# Patient Record
Sex: Male | Born: 1940 | Race: White | Hispanic: No | Marital: Married | Smoking: Never smoker
Health system: Southern US, Community
[De-identification: ages and names within clinical notes are randomized; demographics above are authoritative.]

## PROBLEM LIST (undated history)

## (undated) DIAGNOSIS — I639 Cerebral infarction, unspecified: Secondary | ICD-10-CM

## (undated) DIAGNOSIS — I4891 Unspecified atrial fibrillation: Secondary | ICD-10-CM

## (undated) DIAGNOSIS — I2510 Atherosclerotic heart disease of native coronary artery without angina pectoris: Secondary | ICD-10-CM

## (undated) DIAGNOSIS — H53452 Other localized visual field defect, left eye: Secondary | ICD-10-CM

## (undated) DIAGNOSIS — Z87442 Personal history of urinary calculi: Secondary | ICD-10-CM

## (undated) DIAGNOSIS — G20A1 Parkinson's disease without dyskinesia, without mention of fluctuations: Secondary | ICD-10-CM

## (undated) DIAGNOSIS — E7800 Pure hypercholesterolemia, unspecified: Secondary | ICD-10-CM

## (undated) DIAGNOSIS — I219 Acute myocardial infarction, unspecified: Secondary | ICD-10-CM

## (undated) DIAGNOSIS — G20 Parkinson's disease: Secondary | ICD-10-CM

## (undated) DIAGNOSIS — I509 Heart failure, unspecified: Secondary | ICD-10-CM

## (undated) HISTORY — PX: CARDIAC SURGERY: SHX584

## (undated) HISTORY — DX: Parkinson's disease without dyskinesia, without mention of fluctuations: G20.A1

## (undated) HISTORY — DX: Parkinson's disease: G20

---

## 1996-02-19 HISTORY — PX: CORONARY ARTERY BYPASS GRAFT: SHX141

## 1998-02-18 DIAGNOSIS — I639 Cerebral infarction, unspecified: Secondary | ICD-10-CM

## 1998-02-18 HISTORY — DX: Cerebral infarction, unspecified: I63.9

## 2004-05-26 ENCOUNTER — Emergency Department: Payer: Self-pay | Admitting: Emergency Medicine

## 2007-02-17 ENCOUNTER — Ambulatory Visit: Payer: Self-pay | Admitting: Neurology

## 2007-02-20 ENCOUNTER — Ambulatory Visit: Payer: Self-pay | Admitting: Cardiology

## 2007-06-19 ENCOUNTER — Ambulatory Visit: Payer: Self-pay | Admitting: Gastroenterology

## 2008-12-10 ENCOUNTER — Emergency Department: Payer: Self-pay | Admitting: Emergency Medicine

## 2009-09-19 ENCOUNTER — Ambulatory Visit: Payer: Self-pay | Admitting: Cardiology

## 2015-08-21 ENCOUNTER — Encounter: Payer: Self-pay | Admitting: Occupational Therapy

## 2015-08-21 ENCOUNTER — Ambulatory Visit: Payer: Medicare Other | Attending: Neurology | Admitting: Occupational Therapy

## 2015-08-21 ENCOUNTER — Ambulatory Visit: Payer: Medicare Other | Admitting: Speech Pathology

## 2015-08-21 ENCOUNTER — Encounter: Payer: Self-pay | Admitting: Speech Pathology

## 2015-08-21 VITALS — BP 142/78 | HR 60

## 2015-08-21 DIAGNOSIS — R490 Dysphonia: Secondary | ICD-10-CM | POA: Diagnosis present

## 2015-08-21 DIAGNOSIS — R2681 Unsteadiness on feet: Secondary | ICD-10-CM | POA: Diagnosis present

## 2015-08-21 DIAGNOSIS — R262 Difficulty in walking, not elsewhere classified: Secondary | ICD-10-CM | POA: Insufficient documentation

## 2015-08-21 DIAGNOSIS — M6281 Muscle weakness (generalized): Secondary | ICD-10-CM

## 2015-08-21 DIAGNOSIS — R278 Other lack of coordination: Secondary | ICD-10-CM | POA: Diagnosis present

## 2015-08-21 NOTE — Therapy (Signed)
[place] [facility] [address] [place], [place], [address] Phone: [phone]   Fax:  [phone]  Speech Language Pathology Evaluation/Discharge Summary  Patient Details  Name: Derrick Miller MRN: [id number] Date of Birth: 11-Sep-1940 Referring Provider: Dr. Sherryll BurgerShah  Encounter Date: 08/21/2015      End of Session - 08/21/15 1539    Visit Number 1   Number of Visits 1   Date for SLP Re-Evaluation 08/21/15   SLP Start Time 1400   SLP Stop Time  1445   SLP Time Calculation (min) 45 min   Activity Tolerance Patient tolerated treatment well      Past Medical History  Diagnosis Date   Parkinson disease (HCC)     No past surgical history on file.  There were no vitals filed for this visit.      Subjective Assessment - 08/21/15 1537    Subjective The patient reports minimal speech/voice changes due to Parkinson's; denies hypophonia, hoarse vocal quality, or monotone speech.   He and his wife endorse decreased facial expressions.  There are no reported swallowing problems.   Patient is accompained by: Family member   Currently in Pain? No/denies            SLP Evaluation [facility] - 08/21/15 1533    SLP Visit Information   SLP Received On 08/21/15   Referring Provider Dr. Sherryll BurgerShah   Onset Date 04/26/2015   Medical Diagnosis Parkinson's   Subjective   Subjective The patient reports minimal speech/voice changes due to Parkinson's   Patient/Family Stated Goal None   Pain Assessment   Currently in Pain? No/denies   Prior Functional Status   Cognitive/Linguistic Baseline Within functional limits   Oral Motor/Sensory Function   Overall Oral Motor/Sensory Function Appears within functional limits for tasks assessed   Motor Speech   Overall Motor Speech Appears within functional limits for tasks assessed   Standardized Assessments   Standardized Assessments  Other Assessment  LSVT-LOUD Perceptual voice Evaluation      LSVT-LOUD Voice Evaluation Voice history: The patient reports minimal speech/voice changes due to Parkinsons; denies hypophonia, hoarse vocal quality, or monotone speech.   He and his wife endorse decreased facial expressions.  There are no reported swallowing problems.  Maximum phonation time for sustained ah: 26 seconds  Mean intensity during sustained ah: 72 dB   Mean intensity sustained during conversational speech: 72 dB  Average fundamental frequency during sustained ah: 174 Hz   Visi-Pitch: Multi-Dimensional Voice Program (MDVP)  MDVP extracts objective quantitative values (Relative Average Perturbation, Shimmer, Voice Turbulence Index, and Noise to Harmonic Ratio) on sustained phonation, which are displayed graphically and numerically in comparison to a built-in normative database.  The patient exhibited values outside the norm for Shimmer (minimal).       SLP Education - 08/21/15 1538    Education provided Yes   Education Details LSVT-LOUD program; communication/swallowing symptoms to be on the look out for   Person(s) Educated Patient;Spouse   Methods Explanation   Comprehension Verbalized understanding              Plan - 08/21/15 1540    Clinical Impression Statement This 75 year old man with diagnosed Parkinson's disease is presenting with minimal communication difficulties due to Parkinson's disease.  The patient and his wife deny hypophonia, hoarse vocal quality, or monotone speech.   He and his wife endorse decreased facial expressions.  There are no reported swallowing problems.  Evaluation corroborates this-  the patient is presenting with vocal loudness and quality within normal limits.  The patient has opted to not begin LSVT-LOUD at this time.  The patient and his wife were educated regarding symptoms to be aware of that would warrant this treatment.  He will be coming for LSVT-BIG and we will monitor him for candidacy for the LOUD portion.   Speech Therapy  Frequency One time visit   Treatment/Interventions Other (comment)  LSVT-LOUD evaluation   SLP Home Exercise Plan Patient and wife given guidelines to determine need for intervention   Consulted and Agree with Plan of Care Patient;Family member/caregiver   Family Member Consulted Spouse      Patient will benefit from skilled therapeutic intervention in order to improve the following deficits and impairments:   Dysphonia - Plan: SLP plan of care cert/re-cert      G-Codes - 08/21/15 1543    Functional Assessment Tool Used LSVT-LOUD Perceptual Voice Evaluation   Functional Limitations Voice   Voice Current Status (G9171) At least 1 percent but less than 20 percent impaired, limited or restricted   Voice Goal Status (G9172) At least 1 percent but less than 20 percent impaired, limited or restricted   Voice Discharge Status (Z6109(G9173) At least 1 percent but less than 20 percent impaired, limited or restricted      Problem List There are no active problems to display for this patient.  Dollene PrimroseSusan G Jerric Oyen, MS/CCC- SLP  Leandrew KoyanagiAbernathy, Susie 08/21/2015, 3:49 PM  [place] [facility] [address] [place], [place], [address] Phone: [phone]   Fax:  [phone]  Name: Donshay C Miller MRN: [id number] Date of Birth: 1941-02-02

## 2015-08-24 NOTE — Therapy (Signed)
[place] [facility] [address] [place], [place], [address] Phone: [phone]   Fax:  [phone]  Occupational Therapy Evaluation  Patient Details  Name: Derrick Miller MRN: [id number] Date of Birth: 17-Oct-1940 No Data Recorded  Encounter Date: 08/21/2015      OT End of Session - 08/23/15 1533    Visit Number 1   Number of Visits 17   Date for OT Re-Evaluation 10/09/15   Authorization Type Medicare G code 1   OT Start Time 1300   OT Stop Time 1400   OT Time Calculation (min) 60 min   Activity Tolerance Patient tolerated treatment well   Behavior [facility] Therapy [facility] for tasks assessed/performed      Past Medical History  Diagnosis Date  . Parkinson disease (HCC)     History reviewed. No pertinent past surgical history.  Filed Vitals:   08/24/15 1533  BP: 142/78  Pulse: 60  SpO2: 98%        Subjective Assessment - 08/23/15 1528    Subjective  Patient reports he has had Parkinson's disease for about 2 years now, Reports having difficulty with going from sit to stand, navigating narrow places, rolling over in bed, right resting hand tremor and difficulty with handwriting.    Patient Stated Goals Patient wants to be as independent as possible. "Walk better"   Currently in Pain? No/denies   Multiple Pain Sites No           [facility] OT Assessment - 08/23/15 1529    Assessment   Diagnosis Parkinson's   Prior Therapy none   Balance Screen   Has the patient fallen in the past 6 months Yes   How many times? 1   Has the patient had a decrease in activity level because of a fear of falling?  No   Is the patient reluctant to leave their home because of a fear of falling?  No   Home  Environment   Family/patient expects to be discharged to: Private residence   Living Arrangements Spouse/significant other   Available Help at Discharge Family   Type of Home House   Home Access Stairs   Home Layout One level   Bathroom Copywriter, advertisinghower/Tub Walk-in Shower;Door   EcologistBathroom Toilet Standard   Bathroom Accessibility No   Lives With Spouse  judy   Prior Function   Level of Independence Independent   Vocation Retired   ADL   Eating/Feeding Modified independent   Grooming Modified independent   Upper Body Bathing Modified independent   Lower Body Bathing Modified independent   Upper Body Dressing Increased time;Needs assist for fasteners   Lower Body Dressing Increased time   Toilet Tranfer Modified independent   Toileting -  Hygiene Modified Independent;Increase time   Tub/Shower Transfer Modified independent   IADL   Prior Level of Function Shopping independent   Shopping Shops independently for small purchases   Light Housekeeping Maintains house alone or with occasional assistance   Meal Prep Prepares adequate meal if supplied with ingredients   Training and development officerCommunity Mobility Drives own vehicle   Medication Management Has difficulty remembering to take medication   Financial Management Manages day-to-day purchases, but needs help with banking, major purchases, etc.   Vision - History   Baseline Vision Wears contact  left visual field deficit, lower quadrant   Cognition   Memory Impaired   Memory Impairment Decreased short term memory   Sensation   Light Touch Appears Intact  Stereognosis Appears Intact   Hot/Cold Appears Intact   Proprioception Appears Intact   Coordination   Gross Motor Movements are Fluid and Coordinated No   Fine Motor Movements are Fluid and Coordinated No   Finger Nose Finger Test mildly impaired   9 Hole Peg Test Right;Left   Right 9 Hole Peg Test 30 secs   Left 9 Hole Peg Test 31 secs   Tone   Assessment Location --   ROM / Strength   AROM / PROM / Strength AROM;Strength   AROM   Overall AROM  Within functional limits for tasks performed   Strength   Overall Strength Deficits   Overall Strength Comments BUE strength 4/5 overall   Hand Function   Right Hand Grip (lbs) 80#    Left Hand Grip (lbs) 65#                         OT Education - 08/23/15 1532    Education provided Yes   Education Details LSVT BIG, goals   Person(s) Educated Patient;Spouse   Methods Explanation;Demonstration;Verbal cues   Comprehension Verbal cues required;Returned demonstration;Verbalized understanding             OT Long Term Goals - 08/24/15 1534    OT LONG TERM GOAL #1   Title Patient will improve gait speed and endurance and be able to walk 1500 feet in 6 minutes to negotiate around the home and community safely in 4 weeks   Baseline 1395 feet at eval   Time 4   Period Weeks   Status New   OT LONG TERM GOAL #2   Title Patient will complete HEP for maximal daily exercises with modified independence in 4 weeks    Baseline no current program   Time 4   Period Weeks   Status New   OT LONG TERM GOAL #3   Title Patient will transfer from sit to stand without the use of arms safely and independently from a variety of chairs/surfaces in 4 weeks.   Baseline 18 secs 5 times sit to stand   Time 4   Period Weeks   Status New   OT LONG TERM GOAL #4   Title Patient will demonstrate rolling in bed with modified independence.   Baseline difficulty at eval   Time 4   Period Weeks   Status New   OT LONG TERM GOAL #5   Title Patient will complete printing and signing name on important documents with 100 legibility and no micrographia.   Baseline mild micrographia noted   Time 4   Period Weeks   Status New   Long Term Additional Goals   Additional Long Term Goals Yes   OT LONG TERM GOAL #6   Title Patient will complete navigating in narrow spaces without evidence of freezing or hesitations.   Time 4   Period Weeks   Status New               Plan - 08/23/15 1534    Clinical Impression Statement Patient is a 75 yo male diagnosed with Parkinson's disease and was referred by his physician for LSVT BIG program. Patient presents with forwards  flexed posture, tremors in the right hand, decreased step length with gait patterns, decreased ability to transition from sit to stand, decreased reciprocal arm swing, decreased balance, freezing/hesitation of gait with initiation of gait as well as with turns and especially in narrow places, decreased coordination, mild  micrographia with handwriting and decreased muscle strength which affect his ability to perform daily tasks. The patient is judged to be an excellent candidate for the LSVT BIG program. He would benefit from and was referred for the LSVT BIG program which is an intensive program designed specifically for Parkinson's patients with a focus on increasing amplitude and speed of movements, improving self-care and daily tasks and providing patients with daily exercises to improve overall function. It is recommended that the patient receive the LSVT BIG program which is comprised of 16 intensive sessions (4 times per week for 4 weeks, one hour sessions). Prognosis for improvement is good based on his motivation and family support. LSVT BIG has been documented in the literature as efficacious for individuals with Parkinson's disease.        Rehab Potential Good   OT Frequency 4x / week   OT Duration 4 weeks   OT Treatment/Interventions Self-care/ADL training;Therapeutic exercise;Functional Mobility Training;Patient/family education;Neuromuscular education;Balance training;Therapeutic exercises;DME and/or AE instruction;Therapeutic activities;Gait Training;Stair Training   Consulted and Agree with Plan of Care Patient;Family member/caregiver   Family Member Consulted wife, Darel HongJudy      Patient will benefit from skilled therapeutic intervention in order to improve the following deficits and impairments:  Abnormal gait, Improper body mechanics, Decreased activity tolerance, Decreased knowledge of use of DME, Decreased strength, Decreased balance, Decreased mobility, Difficulty walking, Decreased  coordination, Pain, Impaired UE functional use  Visit Diagnosis: Difficulty in walking, not elsewhere classified - Plan: Ot plan of care cert/re-cert  Muscle weakness (generalized) - Plan: Ot plan of care cert/re-cert  Other lack of coordination - Plan: Ot plan of care cert/re-cert  Unsteadiness on feet - Plan: Ot plan of care cert/re-cert    Problem List There are no active problems to display for this patient.  Kerrie Buffalomy T Akeyla Molden, OTR/L, CLT  Shaquitta Burbridge 08/24/2015, 3:44 PM  [place] [facility] [address] [place], [place], [address] Phone: [phone]   Fax:  [phone]  Name: Derrick Miller MRN: [id number] Date of Birth: [birth year]

## 2015-08-28 ENCOUNTER — Encounter: Payer: Medicare Other | Admitting: Speech Pathology

## 2015-08-28 ENCOUNTER — Encounter: Payer: Medicare Other | Admitting: Occupational Therapy

## 2015-08-29 ENCOUNTER — Encounter: Payer: Medicare Other | Admitting: Occupational Therapy

## 2015-08-29 ENCOUNTER — Encounter: Payer: Medicare Other | Admitting: Speech Pathology

## 2015-08-30 ENCOUNTER — Encounter: Payer: Medicare Other | Admitting: Occupational Therapy

## 2015-08-30 ENCOUNTER — Encounter: Payer: Medicare Other | Admitting: Speech Pathology

## 2015-08-31 ENCOUNTER — Encounter: Payer: Medicare Other | Admitting: Speech Pathology

## 2015-08-31 ENCOUNTER — Encounter: Payer: Medicare Other | Admitting: Occupational Therapy

## 2015-09-04 ENCOUNTER — Encounter: Payer: Medicare Other | Admitting: Occupational Therapy

## 2015-09-04 ENCOUNTER — Encounter: Payer: Medicare Other | Admitting: Speech Pathology

## 2015-09-05 ENCOUNTER — Encounter: Payer: Medicare Other | Admitting: Speech Pathology

## 2015-09-05 ENCOUNTER — Encounter: Payer: Medicare Other | Admitting: Occupational Therapy

## 2015-09-06 ENCOUNTER — Encounter: Payer: Medicare Other | Admitting: Speech Pathology

## 2015-09-06 ENCOUNTER — Encounter: Payer: Medicare Other | Admitting: Occupational Therapy

## 2015-09-07 ENCOUNTER — Encounter: Payer: Medicare Other | Admitting: Occupational Therapy

## 2015-09-07 ENCOUNTER — Encounter: Payer: Medicare Other | Admitting: Speech Pathology

## 2015-09-11 ENCOUNTER — Encounter: Payer: Medicare Other | Admitting: Speech Pathology

## 2015-09-11 ENCOUNTER — Encounter: Payer: Medicare Other | Admitting: Occupational Therapy

## 2015-09-12 ENCOUNTER — Encounter: Payer: Medicare Other | Admitting: Speech Pathology

## 2015-09-12 ENCOUNTER — Encounter: Payer: Medicare Other | Admitting: Occupational Therapy

## 2015-09-13 ENCOUNTER — Encounter: Payer: Medicare Other | Admitting: Occupational Therapy

## 2015-09-13 ENCOUNTER — Encounter: Payer: Medicare Other | Admitting: Speech Pathology

## 2015-09-14 ENCOUNTER — Encounter: Payer: Medicare Other | Admitting: Speech Pathology

## 2015-09-14 ENCOUNTER — Encounter: Payer: Medicare Other | Admitting: Occupational Therapy

## 2015-09-18 ENCOUNTER — Encounter: Payer: Medicare Other | Admitting: Occupational Therapy

## 2015-09-18 ENCOUNTER — Encounter: Payer: Medicare Other | Admitting: Speech Pathology

## 2015-09-19 ENCOUNTER — Encounter: Payer: Medicare Other | Admitting: Speech Pathology

## 2015-09-19 ENCOUNTER — Encounter: Payer: Medicare Other | Admitting: Occupational Therapy

## 2015-09-20 ENCOUNTER — Encounter: Payer: Medicare Other | Admitting: Speech Pathology

## 2015-09-20 ENCOUNTER — Encounter: Payer: Medicare Other | Admitting: Occupational Therapy

## 2015-09-21 ENCOUNTER — Encounter: Payer: Medicare Other | Admitting: Speech Pathology

## 2015-09-21 ENCOUNTER — Encounter: Payer: Medicare Other | Admitting: Occupational Therapy

## 2015-10-02 ENCOUNTER — Ambulatory Visit: Payer: Medicare Other | Admitting: Occupational Therapy

## 2015-10-03 ENCOUNTER — Ambulatory Visit: Payer: Medicare Other | Attending: Neurology | Admitting: Occupational Therapy

## 2015-10-03 DIAGNOSIS — R262 Difficulty in walking, not elsewhere classified: Secondary | ICD-10-CM | POA: Diagnosis present

## 2015-10-03 DIAGNOSIS — R2681 Unsteadiness on feet: Secondary | ICD-10-CM | POA: Insufficient documentation

## 2015-10-03 DIAGNOSIS — M6281 Muscle weakness (generalized): Secondary | ICD-10-CM | POA: Diagnosis present

## 2015-10-03 DIAGNOSIS — R278 Other lack of coordination: Secondary | ICD-10-CM | POA: Insufficient documentation

## 2015-10-04 ENCOUNTER — Ambulatory Visit: Payer: Medicare Other | Admitting: Occupational Therapy

## 2015-10-04 DIAGNOSIS — R262 Difficulty in walking, not elsewhere classified: Secondary | ICD-10-CM | POA: Diagnosis not present

## 2015-10-04 DIAGNOSIS — M6281 Muscle weakness (generalized): Secondary | ICD-10-CM

## 2015-10-04 DIAGNOSIS — R2681 Unsteadiness on feet: Secondary | ICD-10-CM

## 2015-10-04 DIAGNOSIS — R278 Other lack of coordination: Secondary | ICD-10-CM

## 2015-10-05 ENCOUNTER — Ambulatory Visit: Payer: Medicare Other | Admitting: Occupational Therapy

## 2015-10-05 DIAGNOSIS — R2681 Unsteadiness on feet: Secondary | ICD-10-CM

## 2015-10-05 DIAGNOSIS — M6281 Muscle weakness (generalized): Secondary | ICD-10-CM

## 2015-10-05 DIAGNOSIS — R278 Other lack of coordination: Secondary | ICD-10-CM

## 2015-10-05 DIAGNOSIS — R262 Difficulty in walking, not elsewhere classified: Secondary | ICD-10-CM | POA: Diagnosis not present

## 2015-10-07 ENCOUNTER — Encounter: Payer: Self-pay | Admitting: Occupational Therapy

## 2015-10-07 NOTE — Therapy (Signed)
[place] [facility] [address] [place], [place], [address] Phone: [phone]   Fax:  [phone]  Occupational Therapy Treatment  Patient Details  Name: Derrick Miller MRN: [id number] Date of Birth: Nov 23, 1940 No Data Recorded  Encounter Date: 10/05/2015      OT End of Session - 10/07/15 2005    Visit Number 4   Number of Visits 17   Date for OT Re-Evaluation 11/10/15   Authorization Type Medicare G code 4   OT Start Time 1102   OT Stop Time 1205   OT Time Calculation (min) 63 min   Activity Tolerance Patient tolerated treatment well   Behavior During Therapy [facility] for tasks assessed/performed      Past Medical History:  Diagnosis Date  . Parkinson disease (HCC)     History reviewed. No pertinent surgical history.  There were no vitals filed for this visit.      Subjective Assessment - 10/07/15 2001    Subjective  patient reports he was able to perform floor to ceiling exercise at home and probably performed 150 repetitions. He reports low back this date. Advised patient to perform 10 repetitions of each exercise two times a day and monitor back pain. Wife present and taking notes written exercise program for Home.    Patient is accompained by: Family member   Patient Stated Goals Patient wants to be as independent as possible. "Walk better"   Currently in Pain? Yes   Pain Score 4    Pain Location Back   Pain Orientation Lower   Pain Descriptors / Indicators Dull   Pain Type Acute pain   Pain Onset Yesterday   Multiple Pain Sites No                      OT Treatments/Exercises (OP) - 10/07/15 2002      ADLs   ADL Comments Functional component tasks as follows; sit to stand, rolling in bed, turning without hesitation, initiation of stepping, managing curbs., performed with verbal cues and minimal assist      Exercises   Exercises Neurological Re-education     Neurological Re-education  Exercises   Other Exercises 1 Patient seen for instruction of LSVT BIG exercises: LSVT Daily Session Maximal Daily Exercises: Sustained movements are designed to rescale the amplitude of movement output for generalization to daily functional activities. Performed as follows for 1 set of 10 repetitions each: Multi directional sustained movements- 1) Floor to ceiling, 2) Side to side. Multi directional Repetitive movements performed in standing and are designed to provide retraining effort needed for sustained muscle activation in tasks Performed as follows: 3) Step and reach forward, 4) Step and Reach Backwards, 5) Step and reach sideways, 6) Rock and reach forward/backward, 7) Rock and reach sideways. Seated exercises required cues and therapist guiding. For standing exercises, patient required minimal to moderate assist to perform and for balance, . Sit to stand from mat table on lowest setting with cues for weight shift, technique and CGA to minimal assist for 10 reps for 1 set.    Other Exercises 2 instructed on adapted version of exercises this date with wife present.                OT Education - 10/07/15 2004    Education provided Yes   Education Details adapted maximal daily exercises with use of chair   Person(s) Educated Patient;Spouse   Methods Explanation;Demonstration;Tactile cues;Verbal cues  Comprehension Verbalized understanding;Returned demonstration;Verbal cues required;Tactile cues required             OT Long Term Goals - 08/24/15 1534      OT LONG TERM GOAL #1   Title Patient will improve gait speed and endurance and be able to walk 1500 feet in 6 minutes to negotiate around the home and community safely in 4 weeks   Baseline 1395 feet at eval   Time 4   Period Weeks   Status New     OT LONG TERM GOAL #2   Title Patient will complete HEP for maximal daily exercises with modified independence in 4 weeks    Baseline no current program   Time 4   Period  Weeks   Status New     OT LONG TERM GOAL #3   Title Patient will transfer from sit to stand without the use of arms safely and independently from a variety of chairs/surfaces in 4 weeks.   Baseline 18 secs 5 times sit to stand   Time 4   Period Weeks   Status New     OT LONG TERM GOAL #4   Title Patient will demonstrate rolling in bed with modified independence.   Baseline difficulty at eval   Time 4   Period Weeks   Status New     OT LONG TERM GOAL #5   Title Patient will complete printing and signing name on important documents with 100 legibility and no micrographia.   Baseline mild micrographia noted   Time 4   Period Weeks   Status New     Long Term Additional Goals   Additional Long Term Goals Yes     OT LONG TERM GOAL #6   Title Patient will complete navigating in narrow spaces without evidence of freezing or hesitations.   Time 4   Period Weeks   Status New               Plan - 10/07/15 2006    Clinical Impression Statement Patient becoming more familiar with exercises this date but relies on therapist instruction and pictures from handouts. He will need to monitor Low back pain while performing exercises. It is likely he completed too many repetitions at home. Patient demonstrates mild freezing behaviors during initiation of gate and turning behaviors. Will you to benefit from therapist instruction, shaping, calibration, and repetition of exercises to become more proficient. Will plan to focus initiation and termination gait patterns. Patient to perform maximal exercise two times for a day for the next 3 days.   Rehab Potential Good   Clinical Impairments Affecting Rehab Potential progressive disease process, balance   OT Frequency 4x / week   OT Duration 4 weeks   OT Treatment/Interventions Self-care/ADL training;Therapeutic exercise;Functional Mobility Training;Patient/family education;Neuromuscular education;Balance training;Therapeutic exercises;DME  and/or AE instruction;Therapeutic activities;Gait Training;Stair Training   Consulted and Agree with Plan of Care Patient;Family member/caregiver   Family Member Consulted wife, Darel HongJudy      Patient will benefit from skilled therapeutic intervention in order to improve the following deficits and impairments:  Abnormal gait, Improper body mechanics, Decreased activity tolerance, Decreased knowledge of use of DME, Decreased strength, Decreased balance, Decreased mobility, Difficulty walking, Decreased coordination, Pain, Impaired UE functional use  Visit Diagnosis: Difficulty in walking, not elsewhere classified  Unsteadiness on feet  Other lack of coordination  Muscle weakness (generalized)    Problem List There are no active problems to display for this patient.  Brnadon Eoff T  Arne Cleveland, OTR/L, CLT  Olive Zmuda 10/07/2015, 8:08 PM  [place] [facility] MAIN [facility] SERVICES [address] [place], [place], [address] Phone: [phone]   Fax:  [phone]  Name: Derrick Miller MRN: [id number] Date of Birth: 11-Jun-1940

## 2015-10-07 NOTE — Therapy (Signed)
[facility] [address] [place], [place], [address] Phone: [phone]   Fax:  [phone]  Occupational Therapy Treatment  Patient Details  Name: Derrick Miller MRN: [id number] Date of Birth: 21-Oct-1940 No Data Recorded  Encounter Date: 10/04/2015      OT End of Session - 10/07/15 1956    Visit Number 3   Number of Visits 17   Date for OT Re-Evaluation 11/10/15   Authorization Type Medicare G code 3   OT Start Time 1101   OT Stop Time 1200   OT Time Calculation (min) 59 min   Activity Tolerance Patient tolerated treatment well   Behavior During Therapy [facility] for tasks assessed/performed      Past Medical History:  Diagnosis Date  . Parkinson disease (HCC)     History reviewed. No pertinent surgical history.  There were no vitals filed for this visit.      Subjective Assessment - 10/07/15 1952    Subjective  Patient reports he has mild muscle soreness. Patient is concerned about being able to recall exercises. His wife came in for therapy session today do you take notes and observe.    Patient is accompained by: Family member   Pertinent History Patient was evaluated last month however, start date was delayed due to therapist on medical leave and patient preferred to wait to work with this particular therapist.   Patient Stated Goals Patient wants to be as independent as possible. "Walk better"   Currently in Pain? No/denies   Multiple Pain Sites No                      OT Treatments/Exercises (OP) - 10/07/15 1953      ADLs   ADL Comments Functional component tasks as follows; sit to stand, rolling in bed, turning without hesitation, initiation of stepping, managing curbs.     Exercises   Exercises Neurological Re-education     Neurological Re-education Exercises   Other Exercises 1 Patient seen for instruction of LSVT BIG exercises: LSVT Daily Session Maximal Daily Exercises: Sustained  movements are designed to rescale the amplitude of movement output for generalization to daily functional activities. Performed as follows for 1 set of 10 repetitions each: Multi directional sustained movements- 1) Floor to ceiling, 2) Side to side. Multi directional Repetitive movements performed in standing and are designed to provide retraining effort needed for sustained muscle activation in tasks Performed as follows: 3) Step and reach forward, 4) Step and Reach Backwards, 5) Step and reach sideways, 6) Rock and reach forward/backward, 7) Rock and reach sideways. Seated exercises required cues and therapist guiding. For standing exercises, patient required minimal to moderate assist to perform and for balance. Sit to stand from mat table on lowest setting with cues for weight shift, technique and CGA to minimal assist for 10 reps for 1 set.       Implemented use of chair for adapted version of exercises this date for balance and safety.          OT Education - 10/07/15 1955    Education provided Yes   Education Details maximal daily exercises, HEP   Person(s) Educated Patient;Spouse   Methods Explanation;Demonstration;Tactile cues;Verbal cues   Comprehension Verbal cues required;Returned demonstration;Verbalized understanding;Tactile cues required             OT Long Term Goals - 08/24/15 1534      OT LONG TERM GOAL #1  Title Patient will improve gait speed and endurance and be able to walk 1500 feet in 6 minutes to negotiate around the home and community safely in 4 weeks   Baseline 1395 feet at eval   Time 4   Period Weeks   Status New     OT LONG TERM GOAL #2   Title Patient will complete HEP for maximal daily exercises with modified independence in 4 weeks    Baseline no current program   Time 4   Period Weeks   Status New     OT LONG TERM GOAL #3   Title Patient will transfer from sit to stand without the use of arms safely and independently from a variety of  chairs/surfaces in 4 weeks.   Baseline 18 secs 5 times sit to stand   Time 4   Period Weeks   Status New     OT LONG TERM GOAL #4   Title Patient will demonstrate rolling in bed with modified independence.   Baseline difficulty at eval   Time 4   Period Weeks   Status New     OT LONG TERM GOAL #5   Title Patient will complete printing and signing name on important documents with 100 legibility and no micrographia.   Baseline mild micrographia noted   Time 4   Period Weeks   Status New     Long Term Additional Goals   Additional Long Term Goals Yes     OT LONG TERM GOAL #6   Title Patient will complete navigating in narrow spaces without evidence of freezing or hesitations.   Time 4   Period Weeks   Status New               Plan - 10/07/15 1956    Clinical Impression Statement Patient was unable to recall exercises from previous session. He did state that they appeared familiar. Continue to require increased assistance for balance and standing implemented adapted version using a chair. Will plan to issue written and pictorial instructions next session. Wife is planning to attend again next date.    Rehab Potential Good   Clinical Impairments Affecting Rehab Potential progressive disease process, balance   OT Frequency 4x / week   OT Duration 4 weeks   OT Treatment/Interventions Self-care/ADL training;Therapeutic exercise;Functional Mobility Training;Patient/family education;Neuromuscular education;Balance training;Therapeutic exercises;DME and/or AE instruction;Therapeutic activities;Gait Training;Stair Training   Consulted and Agree with Plan of Care Patient;Family member/caregiver   Family Member Consulted wife, Darel HongJudy      Patient will benefit from skilled therapeutic intervention in order to improve the following deficits and impairments:  Abnormal gait, Improper body mechanics, Decreased activity tolerance, Decreased knowledge of use of DME, Decreased strength,  Decreased balance, Decreased mobility, Difficulty walking, Decreased coordination, Pain, Impaired UE functional use  Visit Diagnosis: Difficulty in walking, not elsewhere classified  Muscle weakness (generalized)  Other lack of coordination  Unsteadiness on feet    Problem List There are no active problems to display for this patient.  Kerrie Buffalomy T Lovett, OTR/L, CLT  Lovett,Amy 10/07/2015, 7:58 PM  [facility] [address] [place], [place], [address] Phone: [phone]   Fax:  [phone]  Name: Derrick Miller MRN: [id number] Date of Birth: 08-23-1940

## 2015-10-07 NOTE — Therapy (Signed)
[facility] [address] [place], [place], [address] Phone: [phone]   Fax:  [phone]  Occupational Therapy Treatment  Patient Details  Name: Derrick Miller MRN: [id number] Date of Birth: [birth year] No Data Recorded  Encounter Date: 10/03/2015      OT End of Session - 10/07/15 1937    Visit Number 2   Number of Visits 17   Date for OT Re-Evaluation 11/10/15   Authorization Type Medicare G code 2   OT Start Time 1100   OT Stop Time 1201   OT Time Calculation (min) 61 min   Activity Tolerance Patient tolerated treatment well   Behavior During Therapy [facility] for tasks assessed/performed      Past Medical History:  Diagnosis Date  . Parkinson disease (HCC)     History reviewed. No pertinent surgical history.  There were no vitals filed for this visit.      Subjective Assessment - 10/07/15 1930    Subjective  Patient reports he is ready to get started. He denies any falls in the last four weeks. No complaints this date. His wife brought him to therapy but did not attend session.    Patient is accompained by: Family member   Pertinent History Patient was evaluated last month however, start date was delayed due to therapist on medical leave and patient preferred to wait to work with this particular therapist.   Patient Stated Goals Patient wants to be as independent as possible. "Walk better"   Currently in Pain? No/denies   Multiple Pain Sites No                      OT Treatments/Exercises (OP) - 10/07/15 1933      ADLs   ADL Comments Established functional component skills as follows; sit to stand, rolling in bed, turning without hesitation, initiation of stepping, managing curbs.      Exercises   Exercises Neurological Re-education     Neurological Re-education Exercises   Other Exercises 1 Patient seen for initial instruction of LSVT BIG exercises: LSVT Daily Session Maximal Daily  Exercises: Sustained movements are designed to rescale the amplitude of movement output for generalization to daily functional activities. Performed as follows for 1 set of 10 repetitions each: Multi directional sustained movements- 1) Floor to ceiling, 2) Side to side. Multi directional Repetitive movements performed in standing and are designed to provide retraining effort needed for sustained muscle activation in tasks Performed as follows: 3) Step and reach forward, 4) Step and Reach Backwards, 5) Step and reach sideways, 6) Rock and reach forward/backward, 7) Rock and reach sideways. Seated exercises required cues and therapist guiding.  For standing exercises, patient required minimal to moderate assist to perform and for balance.  Sit to stand from mat table on lowest setting with cues for weight shift, technique and CGA to minimal assist for 10 reps for 1 set.                   OT Education - 10/07/15 1936    Education provided Yes   Education Details LSVT BIG, maximal daily exercises, functional component tasks.   Person(s) Educated Patient   Methods Explanation;Demonstration;Tactile cues;Verbal cues   Comprehension Verbalized understanding;Returned demonstration;Verbal cues required;Tactile cues required             OT Long Term Goals - 08/24/15 1534      OT LONG TERM GOAL #1   Title  Patient will improve gait speed and endurance and be able to walk 1500 feet in 6 minutes to negotiate around the home and community safely in 4 weeks   Baseline 1395 feet at eval   Time 4   Period Weeks   Status New     OT LONG TERM GOAL #2   Title Patient will complete HEP for maximal daily exercises with modified independence in 4 weeks    Baseline no current program   Time 4   Period Weeks   Status New     OT LONG TERM GOAL #3   Title Patient will transfer from sit to stand without the use of arms safely and independently from a variety of chairs/surfaces in 4 weeks.    Baseline 18 secs 5 times sit to stand   Time 4   Period Weeks   Status New     OT LONG TERM GOAL #4   Title Patient will demonstrate rolling in bed with modified independence.   Baseline difficulty at eval   Time 4   Period Weeks   Status New     OT LONG TERM GOAL #5   Title Patient will complete printing and signing name on important documents with 100 legibility and no micrographia.   Baseline mild micrographia noted   Time 4   Period Weeks   Status New     Long Term Additional Goals   Additional Long Term Goals Yes     OT LONG TERM GOAL #6   Title Patient will complete navigating in narrow spaces without evidence of freezing or hesitations.   Time 4   Period Weeks   Status New               Plan - 10/07/15 1938    Clinical Impression Statement Patient was seen this date for initiation of maximal daily exercises as per protocol for LSVT big. Patient required minimal to moderate assistance for completion of exercises with increased assistance for balance during standing exercises. He required moderate verbal cueing for technique. Occasional rest breaks required. He will likely require adapted version of exercises for home program.    Rehab Potential Good   Clinical Impairments Affecting Rehab Potential progressive disease process, balance   OT Frequency 4x / week   OT Duration 4 weeks   OT Treatment/Interventions Self-care/ADL training;Therapeutic exercise;Functional Mobility Training;Patient/family education;Neuromuscular education;Balance training;Therapeutic exercises;DME and/or AE instruction;Therapeutic activities;Gait Veterinary surgeonTraining;Stair Training   Consulted and Agree with Plan of Care Patient      Patient will benefit from skilled therapeutic intervention in order to improve the following deficits and impairments:  Abnormal gait, Improper body mechanics, Decreased activity tolerance, Decreased knowledge of use of DME, Decreased strength, Decreased balance, Decreased  mobility, Difficulty walking, Decreased coordination, Pain, Impaired UE functional use  Visit Diagnosis: Difficulty in walking, not elsewhere classified - Plan: Ot plan of care cert/re-cert  Muscle weakness (generalized) - Plan: Ot plan of care cert/re-cert  Other lack of coordination - Plan: Ot plan of care cert/re-cert  Unsteadiness on feet - Plan: Ot plan of care cert/re-cert    Problem List There are no active problems to display for this patient.  Kerrie Buffalomy T Lovett, OTR/L, CLT  Lovett,Amy 10/07/2015, 7:46 PM  [facility] [address] [place], [place], [address] Phone: [phone]   Fax:  [phone]  Name: Derrick Miller MRN: [id number] Date of Birth: 1940/10/15

## 2015-10-09 ENCOUNTER — Ambulatory Visit: Payer: Medicare Other | Admitting: Occupational Therapy

## 2015-10-09 DIAGNOSIS — R262 Difficulty in walking, not elsewhere classified: Secondary | ICD-10-CM

## 2015-10-09 DIAGNOSIS — R278 Other lack of coordination: Secondary | ICD-10-CM

## 2015-10-09 DIAGNOSIS — M6281 Muscle weakness (generalized): Secondary | ICD-10-CM

## 2015-10-09 DIAGNOSIS — R2681 Unsteadiness on feet: Secondary | ICD-10-CM

## 2015-10-10 ENCOUNTER — Ambulatory Visit: Payer: Medicare Other | Admitting: Occupational Therapy

## 2015-10-10 DIAGNOSIS — R2681 Unsteadiness on feet: Secondary | ICD-10-CM

## 2015-10-10 DIAGNOSIS — R262 Difficulty in walking, not elsewhere classified: Secondary | ICD-10-CM | POA: Diagnosis not present

## 2015-10-10 DIAGNOSIS — R278 Other lack of coordination: Secondary | ICD-10-CM

## 2015-10-10 DIAGNOSIS — M6281 Muscle weakness (generalized): Secondary | ICD-10-CM

## 2015-10-11 ENCOUNTER — Ambulatory Visit: Payer: Medicare Other | Admitting: Occupational Therapy

## 2015-10-11 DIAGNOSIS — R262 Difficulty in walking, not elsewhere classified: Secondary | ICD-10-CM

## 2015-10-11 DIAGNOSIS — R278 Other lack of coordination: Secondary | ICD-10-CM

## 2015-10-11 DIAGNOSIS — R2681 Unsteadiness on feet: Secondary | ICD-10-CM

## 2015-10-11 DIAGNOSIS — M6281 Muscle weakness (generalized): Secondary | ICD-10-CM

## 2015-10-12 ENCOUNTER — Ambulatory Visit: Payer: Medicare Other | Admitting: Occupational Therapy

## 2015-10-12 DIAGNOSIS — M6281 Muscle weakness (generalized): Secondary | ICD-10-CM

## 2015-10-12 DIAGNOSIS — R2681 Unsteadiness on feet: Secondary | ICD-10-CM

## 2015-10-12 DIAGNOSIS — R278 Other lack of coordination: Secondary | ICD-10-CM

## 2015-10-12 DIAGNOSIS — R262 Difficulty in walking, not elsewhere classified: Secondary | ICD-10-CM

## 2015-10-16 ENCOUNTER — Encounter: Payer: Self-pay | Admitting: Occupational Therapy

## 2015-10-16 ENCOUNTER — Ambulatory Visit: Payer: Medicare Other | Admitting: Occupational Therapy

## 2015-10-16 DIAGNOSIS — R262 Difficulty in walking, not elsewhere classified: Secondary | ICD-10-CM

## 2015-10-16 DIAGNOSIS — R278 Other lack of coordination: Secondary | ICD-10-CM

## 2015-10-16 DIAGNOSIS — R2681 Unsteadiness on feet: Secondary | ICD-10-CM

## 2015-10-16 DIAGNOSIS — M6281 Muscle weakness (generalized): Secondary | ICD-10-CM

## 2015-10-17 ENCOUNTER — Ambulatory Visit: Payer: Medicare Other | Admitting: Occupational Therapy

## 2015-10-17 DIAGNOSIS — R262 Difficulty in walking, not elsewhere classified: Secondary | ICD-10-CM

## 2015-10-17 DIAGNOSIS — R278 Other lack of coordination: Secondary | ICD-10-CM

## 2015-10-17 DIAGNOSIS — M6281 Muscle weakness (generalized): Secondary | ICD-10-CM

## 2015-10-17 DIAGNOSIS — R2681 Unsteadiness on feet: Secondary | ICD-10-CM

## 2015-10-18 ENCOUNTER — Encounter: Payer: Self-pay | Admitting: Occupational Therapy

## 2015-10-18 ENCOUNTER — Ambulatory Visit: Payer: Medicare Other | Admitting: Occupational Therapy

## 2015-10-18 DIAGNOSIS — R262 Difficulty in walking, not elsewhere classified: Secondary | ICD-10-CM

## 2015-10-18 DIAGNOSIS — R2681 Unsteadiness on feet: Secondary | ICD-10-CM

## 2015-10-18 DIAGNOSIS — M6281 Muscle weakness (generalized): Secondary | ICD-10-CM

## 2015-10-18 DIAGNOSIS — R278 Other lack of coordination: Secondary | ICD-10-CM

## 2015-10-18 NOTE — Therapy (Signed)
[facility] [address] , [place], [address] Phone: [phone]   Fax:  [phone]  Occupational Therapy Treatment  Patient Details  Name: Derrick Miller MRN: [id number] Date of Birth: April 28, 1940 No Data Recorded  Encounter Date: 10/11/2015      OT End of Session - 10/18/15 1947    Visit Number 7   Number of Visits 17   Date for OT Re-Evaluation 11/10/15   Authorization Type Medicare G code 7   OT Start Time 1100   OT Stop Time 1200   OT Time Calculation (min) 60 min   Activity Tolerance Patient tolerated treatment well   Behavior During Therapy [facility] for tasks assessed/performed      Past Medical History:  Diagnosis Date  . Parkinson disease (HCC)     No past surgical history on file.  There were no vitals filed for this visit.      Subjective Assessment - 10/18/15 1942    Subjective  Patient reports he tried to implement the twist exercise despite advice from therapist and now has increased back pain.  Recommended he not perform twist exercise going forwards, he and his wife demo understanding.    Patient is accompained by: Family member   Patient Stated Goals Patient wants to be as independent as possible. "Walk better"   Currently in Pain? Yes   Pain Score 6    Pain Location Back   Pain Orientation Lower   Pain Descriptors / Indicators Aching   Pain Type Chronic pain   Pain Frequency Constant   Multiple Pain Sites No                      OT Treatments/Exercises (OP) - 10/18/15 1944      ADLs   ADL Comments Functional component tasks as follows; sit to stand, rolling in bed, turning without hesitation, initiation of stepping, managing curbs, performed with verbal cues and minimal assist verbal and tactile cues provided. Patient reports he is working at home on bed mobility. Postural stretch at the wall with cues for technique for 5 reps for 10-30 secs each.      Exercises   Exercises Neurological Re-education     Neurological Re-education Exercises   Other Exercises 1 Patient seen for instruction of LSVT BIG exercises: LSVT Daily Session Maximal Daily Exercises: Sustained movements are designed to rescale the amplitude of movement output for generalization to daily functional activities. Performed as follows for 1 set of 10 repetitions each: Multi directional sustained movements- 1) Floor to ceiling, 2) Side to side. Multi directional Repetitive movements performed in standing and are designed to provide retraining effort needed for sustained muscle activation in tasks Performed as follows: 3) Step and reach forward, 4) Step and Reach Backwards, 5) Step and reach sideways, 6) Rock and reach forward/backward, 7) Rock and reach sideways. Seated exercises required cues and therapist guiding. For standing exercises, patient required minimal to moderate assist to perform and for balance and performed a modified version of exercises due to issues with balance. Sit to stand from mat table on lowest setting with cues for weight shift, technique and SBA for 10 reps for 1 set.   Other Exercises 2 Patient seen for functional mobility outdoors with emphasis on winding pathways, slopes, small hills for 2 trials of 350 feet with cues for amplitude of gait and focus on initiation of movement with cues both verbal and tactile provided.  OT Education - 10/18/15 1947    Education provided Yes   Education Details HEP, leave out twisting exercise   Person(s) Educated Patient;Spouse   Methods Explanation;Demonstration;Tactile cues;Verbal cues   Comprehension Verbal cues required;Returned demonstration;Verbalized understanding;Tactile cues required             OT Long Term Goals - 08/24/15 1534      OT LONG TERM GOAL #1   Title Patient will improve gait speed and endurance and be able to walk 1500 feet in 6 minutes to negotiate around the home and community  safely in 4 weeks   Baseline 1395 feet at eval   Time 4   Period Weeks   Status New     OT LONG TERM GOAL #2   Title Patient will complete HEP for maximal daily exercises with modified independence in 4 weeks    Baseline no current program   Time 4   Period Weeks   Status New     OT LONG TERM GOAL #3   Title Patient will transfer from sit to stand without the use of arms safely and independently from a variety of chairs/surfaces in 4 weeks.   Baseline 18 secs 5 times sit to stand   Time 4   Period Weeks   Status New     OT LONG TERM GOAL #4   Title Patient will demonstrate rolling in bed with modified independence.   Baseline difficulty at eval   Time 4   Period Weeks   Status New     OT LONG TERM GOAL #5   Title Patient will complete printing and signing name on important documents with 100 legibility and no micrographia.   Baseline mild micrographia noted   Time 4   Period Weeks   Status New     Long Term Additional Goals   Additional Long Term Goals Yes     OT LONG TERM GOAL #6   Title Patient will complete navigating in narrow spaces without evidence of freezing or hesitations.   Time 4   Period Weeks   Status New               Plan - 10/18/15 1948    Clinical Impression Statement Patient continues to progress well in all areas, still requires chair/adapted version of exercises and benefits from repetition during session and at home.  He continues to require increased time to implement immediate feedback when performing exercises and can only manage small changes in routine. Will continue to work towards improved balance and performance with exercises and move towards performing standard version of exercises    Rehab Potential Good   Clinical Impairments Affecting Rehab Potential progressive disease process, balance   OT Frequency 4x / week   OT Duration 4 weeks   OT Treatment/Interventions Self-care/ADL training;Therapeutic exercise;Functional  Mobility Training;Patient/family education;Neuromuscular education;Balance training;Therapeutic exercises;DME and/or AE instruction;Therapeutic activities;Gait Training;Stair Training   Consulted and Agree with Plan of Care Patient;Family member/caregiver   Family Member Consulted wife, Darel Hong      Patient will benefit from skilled therapeutic intervention in order to improve the following deficits and impairments:  Abnormal gait, Improper body mechanics, Decreased activity tolerance, Decreased knowledge of use of DME, Decreased strength, Decreased balance, Decreased mobility, Difficulty walking, Decreased coordination, Pain, Impaired UE functional use  Visit Diagnosis: Unsteadiness on feet  Other lack of coordination  Muscle weakness (generalized)  Difficulty in walking, not elsewhere classified    Problem List There are no active problems to  display for this patient.  Kerrie Buffalo, OTR/L, CLT  Lovett,Amy 10/18/2015, 7:52 PM  [facility] MAIN [facility] SERVICES [address] [place], [place], [address] Phone: [phone]   Fax:  [phone]  Name: Derrick Miller MRN: [id number] Date of Birth: 11-10-1940

## 2015-10-18 NOTE — Therapy (Signed)
[facility] MAIN [facility] SERVICES [address] [place], [place], [address] Phone: [phone]   Fax:  [phone]  Occupational Therapy Treatment  Patient Details  Name: Derrick Miller MRN: [id number] Date of Birth: January 13, 1941 No Data Recorded  Encounter Date: 10/10/2015      OT End of Session - 10/18/15 1337    Visit Number 6   Number of Visits 17   Date for OT Re-Evaluation 11/10/15   Authorization Type Medicare G code 6   OT Start Time 1100   OT Stop Time 1201   OT Time Calculation (min) 61 min   Activity Tolerance Patient tolerated treatment well   Behavior During Therapy [facility] for tasks assessed/performed      Past Medical History:  Diagnosis Date  . Parkinson disease (HCC)     History reviewed. No pertinent surgical history.  There were no vitals filed for this visit.      Subjective Assessment - 10/18/15 1330    Subjective  Patient reports he tried doing exercises last night and is becoming more familiar but wants to be "perfect" at them.  Wife reports patient places pressure on himself to do them 100% correct and has been working diligently at home.    Pertinent History Patient was evaluated last month however, start date was delayed due to therapist on medical leave and patient preferred to wait to work with this particular therapist.   Patient Stated Goals Patient wants to be as independent as possible. "Walk better"   Currently in Pain? Yes   Pain Score 3    Pain Location Back   Pain Orientation Lower   Pain Descriptors / Indicators Aching   Pain Type Chronic pain   Pain Onset 1 to 4 weeks ago   Pain Frequency Constant   Multiple Pain Sites No                      OT Treatments/Exercises (OP) - 10/18/15 1333      ADLs   ADL Comments Functional component tasks as follows; sit to stand, rolling in bed, turning without hesitation, initiation of stepping, managing curbs, performed with verbal cues and  minimal assist verbal and tactile cues provided.  Patient reports he is working at home on bed mobility.  Added postural stretch at the wall with cues for technique for 5 reps for 10-30 secs each.      Exercises   Exercises Neurological Re-education     Neurological Re-education Exercises   Other Exercises 1 Patient seen for instruction of LSVT BIG exercises: LSVT Daily Session Maximal Daily Exercises: Sustained movements are designed to rescale the amplitude of movement output for generalization to daily functional activities. Performed as follows for 1 set of 10 repetitions each: Multi directional sustained movements- 1) Floor to ceiling, 2) Side to side. Multi directional Repetitive movements performed in standing and are designed to provide retraining effort needed for sustained muscle activation in tasks Performed as follows: 3) Step and reach forward, 4) Step and Reach Backwards, 5) Step and reach sideways, 6) Rock and reach forward/backward, 7) Rock and reach sideways. Seated exercises required cues and therapist guiding. For standing exercises, patient required minimal to moderate assist to perform and for balance and performed a modified version of exercises due to issues with balance. Sit to stand from mat table on lowest setting with cues for weight shift, technique and CGA for 10 reps for 1 set.   Other Exercises 2  Patient seen for functional mobility indoors for 2 trials of 350 feet with cues for amplitude of gait and focus on initiation of movement with cues both verbal and tactile provided.                OT Education - 10/18/15 1336    Education provided Yes   Education Details Maximal daily exercises, reciprocal arm swing, use of internal cues   Person(s) Educated Patient;Spouse   Methods Explanation;Demonstration;Verbal cues;Tactile cues   Comprehension Verbal cues required;Returned demonstration;Verbalized understanding;Tactile cues required             OT Long  Term Goals - 08/24/15 1534      OT LONG TERM GOAL #1   Title Patient will improve gait speed and endurance and be able to walk 1500 feet in 6 minutes to negotiate around the home and community safely in 4 weeks   Baseline 1395 feet at eval   Time 4   Period Weeks   Status New     OT LONG TERM GOAL #2   Title Patient will complete HEP for maximal daily exercises with modified independence in 4 weeks    Baseline no current program   Time 4   Period Weeks   Status New     OT LONG TERM GOAL #3   Title Patient will transfer from sit to stand without the use of arms safely and independently from a variety of chairs/surfaces in 4 weeks.   Baseline 18 secs 5 times sit to stand   Time 4   Period Weeks   Status New     OT LONG TERM GOAL #4   Title Patient will demonstrate rolling in bed with modified independence.   Baseline difficulty at eval   Time 4   Period Weeks   Status New     OT LONG TERM GOAL #5   Title Patient will complete printing and signing name on important documents with 100 legibility and no micrographia.   Baseline mild micrographia noted   Time 4   Period Weeks   Status New     Long Term Additional Goals   Additional Long Term Goals Yes     OT LONG TERM GOAL #6   Title Patient will complete navigating in narrow spaces without evidence of freezing or hesitations.   Time 4   Period Weeks   Status New               Plan - 10/18/15 1337    Clinical Impression Statement Patient responds well to cues and attempts to put into practice but if cues are given during the exercise, he tends to freeze and has difficulty with getting started with task again.  Tends to want to think things through before acting.  Continue to recommend not to perform the one exercise which involves twisting of the back since pain is slowly starting to improve.    Rehab Potential Good   Clinical Impairments Affecting Rehab Potential progressive disease process, balance   OT  Frequency 4x / week   OT Duration 4 weeks   OT Treatment/Interventions Self-care/ADL training;Therapeutic exercise;Functional Mobility Training;Patient/family education;Neuromuscular education;Balance training;Therapeutic exercises;DME and/or AE instruction;Therapeutic activities;Gait Training;Stair Training   Consulted and Agree with Plan of Care Patient;Family member/caregiver   Family Member Consulted wife, Darel HongJudy      Patient will benefit from skilled therapeutic intervention in order to improve the following deficits and impairments:  Abnormal gait, Improper body mechanics, Decreased activity tolerance, Decreased knowledge  of use of DME, Decreased strength, Decreased balance, Decreased mobility, Difficulty walking, Decreased coordination, Pain, Impaired UE functional use  Visit Diagnosis: Unsteadiness on feet  Other lack of coordination  Muscle weakness (generalized)  Difficulty in walking, not elsewhere classified    Problem List There are no active problems to display for this patient.  Kerrie Buffalo, OTR/L, CLT  Shital Crayton 10/18/2015, 1:41 PM  [place] [facility] MAIN [facility] SERVICES [address] [place], [place], [address] Phone: [phone]   Fax:  [phone]  Name: Giani C Miller MRN: [id number] Date of Birth: 03/13/1940

## 2015-10-18 NOTE — Therapy (Signed)
[facility] MAIN [facility] SERVICES [address] [place], [place], [address] Phone: [phone]   Fax:  [phone]  Occupational Therapy Treatment  Patient Details  Name: Derrick Miller MRN: [id number] Date of Birth: September 03, 1940 No Data Recorded  Encounter Date: 10/09/2015      OT End of Session - 10/18/15 1312    Visit Number 5   Number of Visits 17   Date for OT Re-Evaluation 11/10/15   Authorization Type Medicare G code 5   OT Start Time 1119   OT Stop Time 1205   OT Time Calculation (min) 46 min   Activity Tolerance Patient tolerated treatment well   Behavior During Therapy [facility] for tasks assessed/performed      Past Medical History:  Diagnosis Date  . Parkinson disease (HCC)     History reviewed. No pertinent surgical history.  There were no vitals filed for this visit.      Subjective Assessment - 10/18/15 1307    Subjective  Patient reports his back pain is a little better and has held off on doing the twisting exercise at home.   Patient is accompained by: Family member   Patient Stated Goals Patient wants to be as independent as possible. "Walk better"   Currently in Pain? Yes   Pain Score 3    Pain Location Back   Pain Orientation Lower   Pain Descriptors / Indicators Aching   Pain Type Chronic pain   Pain Onset 1 to 4 weeks ago   Pain Frequency Constant   Multiple Pain Sites No                      OT Treatments/Exercises (OP) - 10/18/15 1308      ADLs   ADL Comments Functional component tasks as follows; sit to stand, rolling in bed, turning without hesitation, initiation of stepping, managing curbs., performed with verbal cues and minimal assist Verbal and tactile cues provided.     Exercises   Exercises Neurological Re-education     Neurological Re-education Exercises   Other Exercises 1 Patient seen for instruction of LSVT BIG exercises: LSVT Daily Session Maximal Daily Exercises: Sustained  movements are designed to rescale the amplitude of movement output for generalization to daily functional activities. Performed as follows for 1 set of 10 repetitions each: Multi directional sustained movements- 1) Floor to ceiling, 2) Side to side. Multi directional Repetitive movements performed in standing and are designed to provide retraining effort needed for sustained muscle activation in tasks Performed as follows: 3) Step and reach forward, 4) Step and Reach Backwards, 5) Step and reach sideways, 6) Rock and reach forward/backward, 7) Rock and reach sideways (omitted this date due to back pain). Seated exercises required cues and therapist guiding. For standing exercises, patient required minimal to moderate assist to perform and for balance, therefore continued use of modified technique with use of chair. Sit to stand from mat table on lowest setting with cues for weight shift, technique and CGA to minimal assist for 10 reps for 1 set.   Other Exercises 2 Patient seen for functional mobility indoors for 2 trials of 300 feet with cues for amplitude of gait and focus on initiation of movement with cues both verbal and tactile provided.                 OT Education - 10/18/15 1312    Education provided Yes   Education Details HEP, adapted exercises  Person(s) Educated Patient;Spouse   Methods Demonstration;Explanation;Verbal cues;Tactile cues;Handout   Comprehension Verbal cues required;Returned demonstration;Verbalized understanding;Tactile cues required             OT Long Term Goals - 08/24/15 1534      OT LONG TERM GOAL #1   Title Patient will improve gait speed and endurance and be able to walk 1500 feet in 6 minutes to negotiate around the home and community safely in 4 weeks   Baseline 1395 feet at eval   Time 4   Period Weeks   Status New     OT LONG TERM GOAL #2   Title Patient will complete HEP for maximal daily exercises with modified independence in 4 weeks     Baseline no current program   Time 4   Period Weeks   Status New     OT LONG TERM GOAL #3   Title Patient will transfer from sit to stand without the use of arms safely and independently from a variety of chairs/surfaces in 4 weeks.   Baseline 18 secs 5 times sit to stand   Time 4   Period Weeks   Status New     OT LONG TERM GOAL #4   Title Patient will demonstrate rolling in bed with modified independence.   Baseline difficulty at eval   Time 4   Period Weeks   Status New     OT LONG TERM GOAL #5   Title Patient will complete printing and signing name on important documents with 100 legibility and no micrographia.   Baseline mild micrographia noted   Time 4   Period Weeks   Status New     Long Term Additional Goals   Additional Long Term Goals Yes     OT LONG TERM GOAL #6   Title Patient will complete navigating in narrow spaces without evidence of freezing or hesitations.   Time 4   Period Weeks   Status New               Plan - 10/18/15 1313    Clinical Impression Statement Patient arriving late for appointment due to delay with getting bloodwork done.  Patient requires moderate cues and assistance from therapist regarding instructions, therapist demo and repetition.  He is easily distracted and has some difficulty when therapist modifies instructions or works towards calibrating the movements. Will need to slowly introduce changes and modifications as patient tolerates.     Rehab Potential Good   Clinical Impairments Affecting Rehab Potential progressive disease process, balance   OT Frequency 4x / week   OT Duration 4 weeks   OT Treatment/Interventions Self-care/ADL training;Therapeutic exercise;Functional Mobility Training;Patient/family education;Neuromuscular education;Balance training;Therapeutic exercises;DME and/or AE instruction;Therapeutic activities;Gait Training;Stair Training   Consulted and Agree with Plan of Care Patient;Family  member/caregiver   Family Member Consulted wife, Darel Hong      Patient will benefit from skilled therapeutic intervention in order to improve the following deficits and impairments:  Abnormal gait, Improper body mechanics, Decreased activity tolerance, Decreased knowledge of use of DME, Decreased strength, Decreased balance, Decreased mobility, Difficulty walking, Decreased coordination, Pain, Impaired UE functional use  Visit Diagnosis: Unsteadiness on feet  Other lack of coordination  Muscle weakness (generalized)  Difficulty in walking, not elsewhere classified    Problem List There are no active problems to display for this patient.  Akasia Ahmad T Arne Cleveland, OTR/L, CLT  Dacoda Finlay 10/18/2015, 1:17 PM  [place] [facility] MAIN [facility] SERVICES [address] [place], [place], [address] Phone: [phone]   Fax:  [phone]  Name: Randale C SwazilandJordan MRN: [id number] Date of Birth: October 12, 1940

## 2015-10-18 NOTE — Therapy (Signed)
[facility] MAIN [facility] [address] [place], [place], [address] Phone: [phone]   Fax:  [phone]  Occupational Therapy Treatment  Patient Details  Name: Derrick C Swazil[place]Jordan MRN: [id number] Date of Birth: 12-05-1940 No Data Recorded  Encounter Date: 10/12/2015      OT End of Session - 10/18/15 2012    Visit Number 8   Number of Visits 17   Date for OT Re-Evaluation 11/10/15   Authorization Type Medicare G code 8   OT Start Time 1101   OT Stop Time 1200   OT Time Calculation (min) 59 min   Activity Tolerance Patient tolerated treatment well   Behavior [facility] for tasks assessed/performed      Past Medical History:  Diagnosis Date  . Parkinson disease (HCC)     History reviewed. No pertinent surgical history.  There were no vitals filed for this visit.      Subjective Assessment - 10/18/15 2006    Subjective  Patient reports he feels he is doing better, catching on to the exercises at home and using a chair for balance.  He reports decreased pain in back this date from last session, 4/10, not performing twisting exercise,   Patient is accompained by: Family member   Pertinent History Patient was evaluated last month however, start date was delayed due to therapist on medical leave and patient preferred to wait to work with this particular therapist.   Patient Stated Goals Patient wants to be as independent as possible. "Walk better"   Currently in Pain? Yes   Pain Score 4    Pain Location Back   Pain Orientation Left   Pain Descriptors / Indicators Aching   Pain Type Chronic pain   Pain Onset 1 to 4 weeks ago   Pain Frequency Constant   Multiple Pain Sites No                      OT Treatments/Exercises (OP) - 10/18/15 2009      ADLs   ADL Comments Functional component tasks as follows; sit to stand, rolling in bed, turning without hesitation, initiation of stepping, managing curbs,  performed with verbal cues and minimal assist verbal and tactile cues provided. Patient reports he is working at home on bed mobility. Postural stretch at the wall with cues for technique for 5 reps for 10-30 secs each.  reciprocal toe tapping and stair negotiation with SBA and cues.       Exercises   Exercises Neurological Re-education     Neurological Re-education Exercises   Other Exercises 1 Patient seen for instruction of LSVT BIG exercises: LSVT Daily Session Maximal Daily Exercises: Sustained movements are designed to rescale the amplitude of movement output for generalization to daily functional activities. Performed as follows for 1 set of 10 repetitions each: Multi directional sustained movements- 1) Floor to ceiling, 2) Side to side. Multi directional Repetitive movements performed in standing and are designed to provide retraining effort needed for sustained muscle activation in tasks Performed as follows: 3) Step and reach forward, 4) Step and Reach Backwards, 5) Step and reach sideways, 6) Rock and reach forward/backward, 7) Rock and reach sideways. Seated exercises required cues and therapist guiding. For standing exercises, patient required minimal to moderate assist to perform and for balance and performed a modified version of exercises due to issues with balance. Sit to stand from mat table on lowest setting with cues for weight shift,  technique and SBA for 10 reps for 1 set.   Other Exercises 2 Balance tasks in parallel bars with balance pad, balance disk and small ball, min assist for balance at times when on unstable surface.  Patient seen for functional mobility outdoors with emphasis on winding pathways, slopes, small hills for 2 trials of 300 feet with cues for amplitude of gait and focus on initiation of movement with cues both verbal and tactile provided.                OT Education - 10/18/15 2012    Education provided Yes   Education Details LSVT BIG principles,  balance tasks, HEP   Person(s) Educated Patient;Spouse   Methods Explanation;Demonstration;Tactile cues;Verbal cues   Comprehension Verbalized understanding;Returned demonstration;Verbal cues required;Tactile cues required             OT Long Term Goals - 08/24/15 1534      OT LONG TERM GOAL #1   Title Patient will improve gait speed and endurance and be able to walk 1500 feet in 6 minutes to negotiate around the home and community safely in 4 weeks   Baseline 1395 feet at eval   Time 4   Period Weeks   Status New     OT LONG TERM GOAL #2   Title Patient will complete HEP for maximal daily exercises with modified independence in 4 weeks    Baseline no current program   Time 4   Period Weeks   Status New     OT LONG TERM GOAL #3   Title Patient will transfer from sit to stand without the use of arms safely and independently from a variety of chairs/surfaces in 4 weeks.   Baseline 18 secs 5 times sit to stand   Time 4   Period Weeks   Status New     OT LONG TERM GOAL #4   Title Patient will demonstrate rolling in bed with modified independence.   Baseline difficulty at eval   Time 4   Period Weeks   Status New     OT LONG TERM GOAL #5   Title Patient will complete printing and signing name on important documents with 100 legibility and no micrographia.   Baseline mild micrographia noted   Time 4   Period Weeks   Status New     Long Term Additional Goals   Additional Long Term Goals Yes     OT LONG TERM GOAL #6   Title Patient will complete navigating in narrow spaces without evidence of freezing or hesitations.   Time 4   Period Weeks   Status New               Plan - 10/18/15 2013    Clinical Impression Statement Since patient is progressing more with maximal daily exercises, added balance tasks to treatment this date.  Will attempt to progress next week to a standard version of exercises now that patient is more familiar and balance is  improving with each session.  He still benefits from keeping changes minimal during sessions for greater implementation and understanding.     Rehab Potential Good   Clinical Impairments Affecting Rehab Potential progressive disease process, balance   OT Frequency 4x / week   OT Duration 4 weeks   OT Treatment/Interventions Self-care/ADL training;Therapeutic exercise;Functional Mobility Training;Patient/family education;Neuromuscular education;Balance training;Therapeutic exercises;DME and/or AE instruction;Therapeutic activities;Gait Training;Stair Training   Consulted and Agree with Plan of Care Patient;Family member/caregiver   Family Member  Consulted wife, Darel Hong      Patient will benefit from skilled therapeutic intervention in order to improve the following deficits and impairments:  Abnormal gait, Improper body mechanics, Decreased activity tolerance, Decreased knowledge of use of DME, Decreased strength, Decreased balance, Decreased mobility, Difficulty walking, Decreased coordination, Pain, Impaired UE functional use  Visit Diagnosis: Unsteadiness on feet  Other lack of coordination  Muscle weakness (generalized)  Difficulty in walking, not elsewhere classified    Problem List There are no active problems to display for this patient.  Reisa Coppola Cornelius Moras, OTR/L, CLT  Viney Acocella 10/18/2015, 8:16 PM  [place] MAIN [facility] SERVICES [address] [place], [place], [address] Phone: [phone]   Fax:  [phone]  Name: Rual C Miller MRN: [id number] Date of Birth: 1941/01/31

## 2015-10-19 ENCOUNTER — Encounter: Payer: Self-pay | Admitting: Occupational Therapy

## 2015-10-19 ENCOUNTER — Ambulatory Visit: Payer: Medicare Other | Admitting: Occupational Therapy

## 2015-10-19 DIAGNOSIS — R2681 Unsteadiness on feet: Secondary | ICD-10-CM

## 2015-10-19 DIAGNOSIS — M6281 Muscle weakness (generalized): Secondary | ICD-10-CM

## 2015-10-19 DIAGNOSIS — R262 Difficulty in walking, not elsewhere classified: Secondary | ICD-10-CM

## 2015-10-19 DIAGNOSIS — R278 Other lack of coordination: Secondary | ICD-10-CM

## 2015-10-19 NOTE — Therapy (Signed)
[place] [address] [place], [place], [address] Phone: [phone]   Fax:  [phone]  Occupational Therapy Treatment  Patient Details  Name: Derrick Miller MRN: [id number] Date of Birth: 1940/10/25 No Data Recorded  Encounter Date: 10/16/2015      OT End of Session - 10/19/15 0913    Visit Number 9   Number of Visits 17   Date for OT Re-Evaluation 11/10/15   Authorization Type Medicare G code 9   OT Start Time 1100   OT Stop Time 1201   OT Time Calculation (min) 61 min   Activity Tolerance Patient tolerated treatment well   Behavior [facility] Therapy [facility] for tasks assessed/performed      Past Medical History:  Diagnosis Date  . Parkinson disease (HCC)     History reviewed. No pertinent surgical history.  There were no vitals filed for this visit.      Subjective Assessment - 10/18/15 2120    Subjective  "I thought I was doing good until you took my chair away and now youre making me sit on a little bitty ball!"  Patient laughing.   Patient is accompained by: Family member   Pertinent History Patient was evaluated last month however, start date was delayed due to therapist on medical leave and patient preferred to wait to work with this particular therapist.   Patient Stated Goals Patient wants to be as independent as possible. "Walk better"   Currently in Pain? Yes                      OT Treatments/Exercises (OP) - 10/19/15 0905      ADLs   ADL Comments Functional component tasks as follows; sit to stand, rolling in bed, turning without hesitation, initiation of stepping, managing curbs, performed with verbal cues and minimal assist verbal and tactile cues provided. Patient reports he is working at home on bed mobility. Postural stretch at the wall with cues for technique for 5 reps for 10-30 secs each. reciprocal toe tapping and stair negotiation with SBA and cues.      Exercises    Exercises Neurological Re-education     Neurological Re-education Exercises   Other Exercises 1 Patient seen for instruction of LSVT BIG exercises: LSVT Daily Session Maximal Daily Exercises: Sustained movements are designed to rescale the amplitude of movement output for generalization to daily functional activities. Performed as follows for 1 set of 10 repetitions each: Multi directional sustained movements- 1) Floor to ceiling, 2) Side to side. Multi directional Repetitive movements performed in standing and are designed to provide retraining effort needed for sustained muscle activation in tasks Performed as follows: 3) Step and reach forward, 4) Step and Reach Backwards, 5) Step and reach sideways, 6) Rock and reach forward/backward, 7) Rock and reach sideways (omitted this date due to back pain). Seated exercises required cues and therapist guiding. For standing exercises, patient required minimal to CGA for balance.  Progressed to performing exercises in standard version this date.  Sit to stand from mat table on lowest setting with cues for weight shift, technique and SBA for 10 reps for 1 set.  Attempted sit to stand from medium therapy ball for unstable low surface, patient able to complete with min to CGA.    Other Exercises 2 Balance tasks in standing using balance pad and engaging in task to toss balls into basket, CGA for 3 sets of 15 reps, patient averaging  4 balls accurately tossed.                 OT Education - 10/19/15 0913    Education provided Yes   Education Details balance, standard version of exercises   Person(s) Educated Patient;Spouse   Methods Explanation;Demonstration;Verbal cues   Comprehension Verbal cues required;Returned demonstration;Verbalized understanding             OT Long Term Goals - 08/24/15 1534      OT LONG TERM GOAL #1   Title Patient will improve gait speed and endurance and be able to walk 1500 feet in 6 minutes to negotiate around the  home and community safely in 4 weeks   Baseline 1395 feet at eval   Time 4   Period Weeks   Status New     OT LONG TERM GOAL #2   Title Patient will complete HEP for maximal daily exercises with modified independence in 4 weeks    Baseline no current program   Time 4   Period Weeks   Status New     OT LONG TERM GOAL #3   Title Patient will transfer from sit to stand without the use of arms safely and independently from a variety of chairs/surfaces in 4 weeks.   Baseline 18 secs 5 times sit to stand   Time 4   Period Weeks   Status New     OT LONG TERM GOAL #4   Title Patient will demonstrate rolling in bed with modified independence.   Baseline difficulty at eval   Time 4   Period Weeks   Status New     OT LONG TERM GOAL #5   Title Patient will complete printing and signing name on important documents with 100 legibility and no micrographia.   Baseline mild micrographia noted   Time 4   Period Weeks   Status New     Long Term Additional Goals   Additional Long Term Goals Yes     OT LONG TERM GOAL #6   Title Patient will complete navigating in narrow spaces without evidence of freezing or hesitations.   Time 4   Period Weeks   Status New               Plan - 10/19/15 0914    Clinical Impression Statement Patient continues to progress in all areas and was able to advance to exercises using standard format and not using a chair. Patient required occasional min assist for balance and cues for technique. Patient progressed to performing sit to stand from lower surfaces such as medium therapy ball with contact guard assist. Recommend patient have the chair beside him at all times when performing exercises at home but advance using both arms as he is able. He is pleased with his progress but admits it is difficult for him to try to transition to the more advanced form of exercises.   Rehab Potential Good   Clinical Impairments Affecting Rehab Potential progressive  disease process, balance   OT Frequency 4x / week   OT Duration 4 weeks   OT Treatment/Interventions Self-care/ADL training;Therapeutic exercise;Functional Mobility Training;Patient/family education;Neuromuscular education;Balance training;Therapeutic exercises;DME and/or AE instruction;Therapeutic activities;Gait Training;Stair Training   Consulted and Agree with Plan of Care Patient;Family member/caregiver   Family Member Consulted wife, Darel Hong      Patient will benefit from skilled therapeutic intervention in order to improve the following deficits and impairments:  Abnormal gait, Improper body mechanics, Decreased activity tolerance, Decreased knowledge  of use of DME, Decreased strength, Decreased balance, Decreased mobility, Difficulty walking, Decreased coordination, Pain, Impaired UE functional use  Visit Diagnosis: Unsteadiness on feet  Other lack of coordination  Muscle weakness (generalized)  Difficulty in walking, not elsewhere classified    Problem List There are no active problems to display for this patient.  Kerrie Buffalo, OTR/L, CLT  Babetta Paterson 10/19/2015, 9:15 AM   [facility] MAIN [facility] SERVICES [address] [place], [place], [address] Phone: [phone]   Fax:  [phone]  Name: Derrick Miller MRN: [id number] Date of Birth: 1941/02/16

## 2015-10-19 NOTE — Therapy (Signed)
[facility] MAIN [facility] SERVICES [address] [place], [place], [address] Phone: [phone]   Fax:  [phone]  Occupational Therapy Treatment  Patient Details  Name: Derrick Miller MRN: [id number] Date of Birth: February 06, 1941 No Data Recorded  Encounter Date: 10/18/2015      OT End of Session - 10/19/15 1448    Visit Number 11   Number of Visits 17   Date for OT Re-Evaluation 11/10/15   Authorization Type Medicare G code 11   OT Start Time 1100   OT Stop Time 1200   OT Time Calculation (min) 60 min   Activity Tolerance Patient tolerated treatment well   Behavior During Therapy [facility] for tasks assessed/performed      Past Medical History:  Diagnosis Date  . Parkinson disease (HCC)     History reviewed. No pertinent surgical history.  There were no vitals filed for this visit.      Subjective Assessment - 10/19/15 1441    Subjective  Patient reports he feels like he is slowly getting it, states, "In the beginning I told my wife I would never be able to learn these exercises but now I feel so much better about them. Wife asking about trying out some of the machines in the gym to see if patient is safe to use them after he finishes therapy so they can come together for silver sneakers.    Patient is accompained by: Family member   Pertinent History Patient was evaluated last month however, start date was delayed due to therapist on medical leave and patient preferred to wait to work with this particular therapist.   Patient Stated Goals Patient wants to be as independent as possible. "Walk better"   Currently in Pain? Yes   Pain Score 2    Pain Location Back   Pain Descriptors / Indicators Aching   Pain Type Chronic pain   Pain Onset 1 to 4 weeks ago   Pain Frequency Constant   Multiple Pain Sites No                      OT Treatments/Exercises (OP) - 10/19/15 1444      ADLs   ADL Comments Functional component  tasks as follows; sit to stand, rolling in bed, turning without hesitation, initiation of stepping, managing curbs, performed with verbal cues and minimal assist verbal and tactile cues provided. Patient reports he is working at home on bed mobility. Postural stretch at the wall with cues for technique for 5 reps for 10-30 secs each. reciprocal toe tapping and stair negotiation with SBA and cues.      Exercises   Exercises Neurological Re-education     Neurological Re-education Exercises   Other Exercises 1 Patient seen for instruction of LSVT BIG exercises: LSVT Daily Session Maximal Daily Exercises: Sustained movements are designed to rescale the amplitude of movement output for generalization to daily functional activities. Performed as follows for 1 set of 10 repetitions each: Multi directional sustained movements- 1) Floor to ceiling, 2) Side to side. Multi directional Repetitive movements performed in standing and are designed to provide retraining effort needed for sustained muscle activation in tasks Performed as follows: 3) Step and reach forward, 4) Step and Reach Backwards, 5) Step and reach sideways, 6) Rock and reach forward/backward, 7) Rock and reach sideways (omitted this date due to back pain). Seated exercises required cues and therapist guiding. For standing exercises, patient required minimal to CGA  for balance. Progressed to performing exercises in standard version this date. Sit to stand from mat table on lowest setting with cues for weight shift, technique and SBA for 10 reps for 1 set. Sit  to stand from medium therapy ball for unstable low surface, patient able to complete with min to CGA.    Other Exercises 2 Patient requested to try balance pad and balls again this date with CGA in standing for 3 trials of 15 balls, averaging 6 balls this date accurately. Functional mobility utilizing BIG principles for 500 feet for 2 sets outdoor setting with slopes, winding paths and slight  inclines./declines., cues for arm swing.                OT Education - 10/19/15 1448    Education provided Yes   Education Details Maximal daily exercises standard version, postural exercises.    Person(s) Educated Patient;Spouse   Methods Explanation;Demonstration;Verbal cues   Comprehension Verbal cues required;Returned demonstration;Verbalized understanding             OT Long Term Goals - 10/19/15 1000      OT LONG TERM GOAL #1   Title Patient will improve gait speed and endurance and be able to walk 1500 feet in 6 minutes to negotiate around the home and community safely in 4 weeks   Baseline 1395 feet at eval   Time 4   Period Weeks   Status On-going     OT LONG TERM GOAL #2   Title Patient will complete HEP for maximal daily exercises with modified independence in 4 weeks    Baseline no current program   Time 4   Period Weeks   Status On-going     OT LONG TERM GOAL #3   Title Patient will transfer from sit to stand without the use of arms safely and independently from a variety of chairs/surfaces in 4 weeks.   Baseline 18 secs 5 times sit to stand   Time 4   Period Weeks   Status On-going     OT LONG TERM GOAL #4   Title Patient will demonstrate rolling in bed with modified independence.   Baseline difficulty at eval   Time 4   Period Weeks   Status On-going     OT LONG TERM GOAL #5   Title Patient will complete printing and signing name on important documents with 100 legibility and no micrographia.   Baseline mild micrographia noted   Time 4   Period Weeks   Status On-going     OT LONG TERM GOAL #6   Title Patient will complete navigating in narrow spaces without evidence of freezing or hesitations.   Time 4   Period Weeks   Status On-going               Plan - 10/19/15 1449    Clinical Impression Statement Patient is diligent in performing his exercises and has made excellent progress.  Continuing to work towards improved  balance tasks and postural stability with improvements shown in standing, sitting, walking and during exercises. Patient now able to identify when he is performing movements incorrectly and requesting to repeat tasks for improved carryover.  Continue to work towards updated goals towards improving functional mobility, balance and performance in daily tasks utilizing BIG principles.     Rehab Potential Good   Clinical Impairments Affecting Rehab Potential progressive disease process, balance   OT Frequency 4x / week   OT Duration 4 weeks  OT Treatment/Interventions Self-care/ADL training;Therapeutic exercise;Functional Mobility Training;Patient/family education;Neuromuscular education;Balance training;Therapeutic exercises;DME and/or AE instruction;Therapeutic activities;Gait Training;Stair Training   Consulted and Agree with Plan of Care Patient;Family member/caregiver   Family Member Consulted wife, Darel Hong      Patient will benefit from skilled therapeutic intervention in order to improve the following deficits and impairments:  Abnormal gait, Improper body mechanics, Decreased activity tolerance, Decreased knowledge of use of DME, Decreased strength, Decreased balance, Decreased mobility, Difficulty walking, Decreased coordination, Pain, Impaired UE functional use  Visit Diagnosis: Unsteadiness on feet  Other lack of coordination  Muscle weakness (generalized)  Difficulty in walking, not elsewhere classified      G-Codes - 2015-11-12 0981    Functional Assessment Tool Used clinical judgment, 6 minute walk test, 5 times sit to stand   Functional Limitation Mobility: Walking and moving around   Mobility: Walking and Moving Around Current Status [phone]) At least 20 percent but less than 40 percent impaired, limited or restricted   Mobility: Walking and Moving Around Goal Status [phone]) At least 1 percent but less than 20 percent impaired, limited or restricted      Problem List There are  no active problems to display for this patient.  Kerrie Buffalo, OTR/L, CLT  Lovett,Amy 2015-11-12, 2:53 PM  [facility] MAIN [facility] SERVICES [address] [place], [place], [address] Phone: [phone]   Fax:  [phone]  Name: Derrick Miller MRN: [id number] Date of Birth: 1940-08-13

## 2015-10-19 NOTE — Therapy (Signed)
[facility] MAIN [facility] SERVICES [address] [place], [place], [address] Phone: [phone]   Fax:  [phone]  Occupational Therapy Treatment/Progress Note  Patient Details  Name: Derrick Miller MRN: [id number] Date of Birth: August 17, 1940 No Data Recorded  Encounter Date: 10/17/2015      OT End of Session - 10/19/15 0936    Visit Number 10   Number of Visits 17   Date for OT Re-Evaluation 11/10/15   Authorization Type Medicare G code 10   OT Start Time 1102   OT Stop Time 1200   OT Time Calculation (min) 58 min   Activity Tolerance Patient tolerated treatment well   Behavior During Therapy [facility] for tasks assessed/performed      Past Medical History:  Diagnosis Date  . Parkinson disease (HCC)     History reviewed. No pertinent surgical history.  There were no vitals filed for this visit.      Subjective Assessment - 10/19/15 0933    Subjective  Patient reports he sees improvement in his standing, walking and being able to transition from sit to stand and supine to sit. Wife present during session and reports improvement with exercises at home as well as posture.    Patient is accompained by: Family member   Pertinent History Patient was evaluated last month however, start date was delayed due to therapist on medical leave and patient preferred to wait to work with this particular therapist.   Patient Stated Goals Patient wants to be as independent as possible. "Walk better"   Currently in Pain? Yes   Pain Score 2    Pain Location Back   Pain Orientation Lower   Pain Descriptors / Indicators Aching   Pain Type Chronic pain   Pain Onset 1 to 4 weeks ago   Multiple Pain Sites No                      OT Treatments/Exercises (OP) - 10/19/15 0934      ADLs   ADL Comments Functional component tasks as follows; sit to stand, rolling in bed, turning without hesitation, initiation of stepping, managing curbs, performed  with verbal cues and minimal assist verbal and tactile cues provided. Patient reports he is working at home on bed mobility. Postural stretch at the wall with cues for technique for 5 reps for 10-30 secs each. reciprocal toe tapping and stair negotiation with SBA and cues.      Exercises   Exercises Neurological Re-education     Neurological Re-education Exercises   Other Exercises 1 Patient seen for instruction of LSVT BIG exercises: LSVT Daily Session Maximal Daily Exercises: Sustained movements are designed to rescale the amplitude of movement output for generalization to daily functional activities. Performed as follows for 1 set of 10 repetitions each: Multi directional sustained movements- 1) Floor to ceiling, 2) Side to side. Multi directional Repetitive movements performed in standing and are designed to provide retraining effort needed for sustained muscle activation in tasks Performed as follows: 3) Step and reach forward, 4) Step and Reach Backwards, 5) Step and reach sideways, 6) Rock and reach forward/backward, 7) Rock and reach sideways (omitted this date due to back pain). Seated exercises required cues and therapist guiding. For standing exercises, patient required minimal to CGA for balance. Progressed to performing exercises in standard version this date. Sit to stand from mat table on lowest setting with cues for weight shift, technique and SBA for 10 reps  for 1 set. Attempted sit to stand from medium therapy ball for unstable low surface, patient able to complete with min to CGA.    Other Exercises 2 Balance tasks in standing using balance pad and engaging in task to toss balls into basket, CGA for 3 sets of 15 reps, patient averaging 5 balls accurately tossed.  6 minute walk test 1550 feet, 5 times sit to stand 16 secs.                  OT Education - 10/19/15 0935    Education provided Yes   Education Details Standard exercises, balance, row exercises with red theraband    Person(s) Educated Patient;Spouse   Methods Explanation;Demonstration;Tactile cues;Verbal cues   Comprehension Verbal cues required;Returned demonstration;Verbalized understanding             OT Long Term Goals - 10/19/15 1000      OT LONG TERM GOAL #1   Title Patient will improve gait speed and endurance and be able to walk 1500 feet in 6 minutes to negotiate around the home and community safely in 4 weeks   Baseline 1395 feet at eval   Time 4   Period Weeks   Status On-going     OT LONG TERM GOAL #2   Title Patient will complete HEP for maximal daily exercises with modified independence in 4 weeks    Baseline no current program   Time 4   Period Weeks   Status On-going     OT LONG TERM GOAL #3   Title Patient will transfer from sit to stand without the use of arms safely and independently from a variety of chairs/surfaces in 4 weeks.   Baseline 18 secs 5 times sit to stand   Time 4   Period Weeks   Status On-going     OT LONG TERM GOAL #4   Title Patient will demonstrate rolling in bed with modified independence.   Baseline difficulty at eval   Time 4   Period Weeks   Status On-going     OT LONG TERM GOAL #5   Title Patient will complete printing and signing name on important documents with 100 legibility and no micrographia.   Baseline mild micrographia noted   Time 4   Period Weeks   Status On-going     OT LONG TERM GOAL #6   Title Patient will complete navigating in narrow spaces without evidence of freezing or hesitations.   Time 4   Period Weeks   Status On-going               Plan - 10/19/15 [id number]    Clinical Impression Statement Patient has continued to make significant progress in all areas. His six minute walk has improved from 1395 feet to 1550 feet with increased amplitude of gait. He has progressed to performing from an adapted version now to a standard version in the clinic and. He reports improvement in bed mobility and the patient  and his wife feel his posture has improved tremendously which has also helped with his balance. He continues to benefit from skilled occupational therapy for intensive LSVT big program. Will continue to work towards updated goals and calibration of movement. Will attempt to add additional distractions this week as patient tolerates and work on [employer] behaviors.   Clinical Impairments Affecting Rehab Potential progressive disease process, balance   OT Frequency 4x / week   OT Duration 4 weeks   OT Treatment/Interventions Self-care/ADL training;Therapeutic  exercise;Functional Mobility Training;Patient/family education;Neuromuscular education;Balance training;Therapeutic exercises;DME and/or AE instruction;Therapeutic activities;Gait Training;Stair Training   Consulted and Agree with Plan of Care Patient;Family member/caregiver   Family Member Consulted wife, Darel Hong      Patient will benefit from skilled therapeutic intervention in order to improve the following deficits and impairments:  Abnormal gait, Improper body mechanics, Decreased activity tolerance, Decreased knowledge of use of DME, Decreased strength, Decreased balance, Decreased mobility, Difficulty walking, Decreased coordination, Pain, Impaired UE functional use  Visit Diagnosis: Unsteadiness on feet  Other lack of coordination  Muscle weakness (generalized)  Difficulty in walking, not elsewhere classified      G-Codes - 11/18/2015 1610    Functional Assessment Tool Used clinical judgment, 6 minute walk test, 5 times sit to stand   Functional Limitation Mobility: Walking and moving around   Mobility: Walking and Moving Around Current Status [phone]) At least 20 percent but less than 40 percent impaired, limited or restricted   Mobility: Walking and Moving Around Goal Status [phone]) At least 1 percent but less than 20 percent impaired, limited or restricted      Problem List There are no active problems to display for this  patient.  Kerrie Buffalo, OTR/L, CLT  Saintclair Schroader 18-Nov-2015, 2:18 PM  [facility] MAIN [facility] SERVICES [address] [place], [place], [address] Phone: [phone]   Fax:  [phone]  Name: Derrick Miller MRN: [id number] Date of Birth: 11-Jun-1940

## 2015-10-19 NOTE — Therapy (Signed)
[facility] [address] [place], [place], [address] Phone: [phone]   Fax:  [phone]  Occupational Therapy Treatment  Patient Details  Name: Derrick Miller MRN: [id number] Date of Birth: 03-01-1940 No Data Recorded  Encounter Date: 10/19/2015      OT End of Session - 10/19/15 1553    Visit Number 12   Number of Visits 17   Date for OT Re-Evaluation 11/10/15   Authorization Type Medicare G code 12   OT Start Time 1101   OT Stop Time 1205   OT Time Calculation (min) 64 min   Activity Tolerance Patient tolerated treatment well   Behavior [facility] Therapy [facility] for tasks assessed/performed      Past Medical History:  Diagnosis Date  . Parkinson disease (HCC)     History reviewed. No pertinent surgical history.  There were no vitals filed for this visit.      Subjective Assessment - 10/19/15 1552    Subjective  Patient reports he has been trying to write but his signature is getting smaller and smaller. He has been working on navigating in crowds and not pausing and hesitating when people walk by   Patient is accompained by: Family member   Patient Stated Goals Patient wants to be as independent as possible. "Walk better"   Currently in Pain? Yes   Pain Score 1    Pain Location Back   Pain Orientation Lower   Pain Descriptors / Indicators Aching   Pain Type Chronic pain   Pain Onset 1 to 4 weeks ago   Pain Frequency Constant   Multiple Pain Sites No                      OT Treatments/Exercises (OP) - 10/19/15 1555      ADLs   ADL Comments Functional component tasks as follows; sit to stand, rolling in bed, turning without hesitation, initiation of stepping, managing curbs, performed with verbal cues and minimal assist verbal and tactile cues provided. Patient reports he is working at home on bed mobility. Postural stretch at the wall with cues for technique for 5 reps for 10-30 secs each.  reciprocal toe tapping and stair negotiation with SBA and cues.  handwriting this date for signature and printing of name with cues for hand flicks prior to writing and cues for size of letters and formation.     Exercises   Exercises Neurological Re-education     Neurological Re-education Exercises   Other Exercises 1 Patient seen for instruction of LSVT BIG exercises: LSVT Daily Session Maximal Daily Exercises: Sustained movements are designed to rescale the amplitude of movement output for generalization to daily functional activities. Performed as follows for 1 set of 10 repetitions each: Multi directional sustained movements- 1) Floor to ceiling, 2) Side to side. Multi directional Repetitive movements performed in standing and are designed to provide retraining effort needed for sustained muscle activation in tasks Performed as follows: 3) Step and reach forward, 4) Step and Reach Backwards, 5) Step and reach sideways, 6) Rock and reach forward/backward, 7) Rock and reach sideways (omitted this date due to back pain). Seated exercises required cues and therapist guiding. For standing exercises, patient required minimal to CGA for balance. Progressed to performing exercises in standard version this date. Sit to stand from mat table on lowest setting with cues for weight shift, technique and SBA for 10 reps for 1 set. Sit to stand from  medium therapy ball for unstable low surface, patient able to complete with min to CGA.    Other Exercises 2 Functional mobility utilizing BIG principles for 500 feet for 2 sets outdoor setting with slopes, winding paths and slight inclines./declines., cues for arm swing.  Focused on managing crowds this date in the hallway without hesitation or freezing behaviors, cues provided both verbal and tactile.                  OT Education - 10/19/15 1552    Education provided Yes   Education Details managing crowds, HEP, handwriting, hand flicks   Person(s) Educated  Patient;Spouse   Methods Explanation;Demonstration;Verbal cues   Comprehension Verbal cues required;Returned demonstration;Verbalized understanding             OT Long Term Goals - 10/19/15 1000      OT LONG TERM GOAL #1   Title Patient will improve gait speed and endurance and be able to walk 1500 feet in 6 minutes to negotiate around the home and community safely in 4 weeks   Baseline 1395 feet at eval   Time 4   Period Weeks   Status On-going     OT LONG TERM GOAL #2   Title Patient will complete HEP for maximal daily exercises with modified independence in 4 weeks    Baseline no current program   Time 4   Period Weeks   Status On-going     OT LONG TERM GOAL #3   Title Patient will transfer from sit to stand without the use of arms safely and independently from a variety of chairs/surfaces in 4 weeks.   Baseline 18 secs 5 times sit to stand   Time 4   Period Weeks   Status On-going     OT LONG TERM GOAL #4   Title Patient will demonstrate rolling in bed with modified independence.   Baseline difficulty at eval   Time 4   Period Weeks   Status On-going     OT LONG TERM GOAL #5   Title Patient will complete printing and signing name on important documents with 100 legibility and no micrographia.   Baseline mild micrographia noted   Time 4   Period Weeks   Status On-going     OT LONG TERM GOAL #6   Title Patient will complete navigating in narrow spaces without evidence of freezing or hesitations.   Time 4   Period Weeks   Status On-going               Plan - 10/19/15 1553    Clinical Impression Statement Focused on being able to manage navigating in crowds of people in the hallway without pausing  in hesitation. Patient requires cues to keep moving and allow people to move out of the way rather than him being frozen and taking increased amounts of time to get down the hallway.  Cues this date for handwriting for forming his signature with using  larger formation of letters for increased legibility and  reading.Continue to focus on skills to improve freezing behaviors with initiation of gait.    Rehab Potential Good   Clinical Impairments Affecting Rehab Potential progressive disease process, balance   OT Frequency 4x / week   OT Duration 4 weeks   OT Treatment/Interventions Self-care/ADL training;Therapeutic exercise;Functional Mobility Training;Patient/family education;Neuromuscular education;Balance training;Therapeutic exercises;DME and/or AE instruction;Therapeutic activities;Gait Training;Stair Training   Consulted and Agree with Plan of Care Patient;Family member/caregiver   Family Member Consulted  wife, Darel Hong      Patient will benefit from skilled therapeutic intervention in order to improve the following deficits and impairments:  Abnormal gait, Improper body mechanics, Decreased activity tolerance, Decreased knowledge of use of DME, Decreased strength, Decreased balance, Decreased mobility, Difficulty walking, Decreased coordination, Pain, Impaired UE functional use  Visit Diagnosis: Unsteadiness on feet  Other lack of coordination  Muscle weakness (generalized)  Difficulty in walking, not elsewhere classified      G-Codes - 10-27-2015 1610    Functional Assessment Tool Used clinical judgment, 6 minute walk test, 5 times sit to stand   Functional Limitation Mobility: Walking and moving around   Mobility: Walking and Moving Around Current Status [phone]) At least 20 percent but less than 40 percent impaired, limited or restricted   Mobility: Walking and Moving Around Goal Status [phone]) At least 1 percent but less than 20 percent impaired, limited or restricted      Problem List There are no active problems to display for this patient.  Kerrie Buffalo, OTR/L, CLT  Riyanshi Wahab 10/19/2015, 3:58 PM  [place] [facility] MAIN [facility] SERVICES [address] [place], [place], [address] Phone:  [phone]   Fax:  [phone]  Name: Derrick Miller MRN: [id number] Date of Birth: 1940/06/04

## 2015-10-24 ENCOUNTER — Ambulatory Visit: Payer: Medicare Other | Attending: Neurology | Admitting: Occupational Therapy

## 2015-10-24 DIAGNOSIS — R2681 Unsteadiness on feet: Secondary | ICD-10-CM | POA: Diagnosis not present

## 2015-10-24 DIAGNOSIS — R262 Difficulty in walking, not elsewhere classified: Secondary | ICD-10-CM

## 2015-10-24 DIAGNOSIS — M6281 Muscle weakness (generalized): Secondary | ICD-10-CM | POA: Diagnosis present

## 2015-10-24 DIAGNOSIS — R278 Other lack of coordination: Secondary | ICD-10-CM | POA: Diagnosis present

## 2015-10-25 ENCOUNTER — Ambulatory Visit: Payer: Medicare Other | Admitting: Occupational Therapy

## 2015-10-25 DIAGNOSIS — R2681 Unsteadiness on feet: Secondary | ICD-10-CM | POA: Diagnosis not present

## 2015-10-25 DIAGNOSIS — M6281 Muscle weakness (generalized): Secondary | ICD-10-CM

## 2015-10-25 DIAGNOSIS — R278 Other lack of coordination: Secondary | ICD-10-CM

## 2015-10-25 DIAGNOSIS — R262 Difficulty in walking, not elsewhere classified: Secondary | ICD-10-CM

## 2015-10-25 NOTE — Therapy (Signed)
[place] [facility] MAIN [facility] SERVICES [address] [place], [place], [address] Phone: [phone]   Fax:  [phone]  Occupational Therapy Treatment  Patient Details  Name: Derrick Miller MRN: [id number] Date of Birth: 06-02-1940 No Data Recorded  Encounter Date: 10/25/2015      OT End of Session - 10/25/15 2103    Visit Number 14   Number of Visits 17   Date for OT Re-Evaluation 11/10/15   Authorization Type Medicare G code 14   OT Start Time 1100   OT Stop Time 1205   OT Time Calculation (min) 65 min   Activity Tolerance Patient tolerated treatment well   Behavior During Therapy [facility] for tasks assessed/performed      Past Medical History:  Diagnosis Date  . Parkinson disease (HCC)     No past surgical history on file.  There were no vitals filed for this visit.      Subjective Assessment - 10/25/15 2057    Subjective  Patient reports he did a half set of exercises this morning and will plan to do another half set this evening. He is questioning whether he can split the exercises in half.   Patient is accompained by: Family member   Patient Stated Goals Patient wants to be as independent as possible. "Walk better"   Currently in Pain? Yes   Pain Score 3    Pain Location Back   Pain Orientation Lower   Pain Descriptors / Indicators Aching   Pain Type Chronic pain   Pain Onset 1 to 4 weeks ago   Multiple Pain Sites No                      OT Treatments/Exercises (OP) - 10/25/15 2059      ADLs   ADL Comments Functional component tasks as follows; sit to stand, rolling in bed, turning without hesitation, initiation of stepping, managing curbs, performed with verbal cues and minimal assist verbal and tactile cues provided. Patient reports he is working at home on bed mobility. Postural stretch at the wall with cues for technique for 5 reps for 10-30 secs each. reciprocal toe tapping and stair negotiation with SBA and  cues.      Neurological Re-education Exercises   Other Exercises 1 Patient seen for instruction of LSVT BIG exercises: LSVT Daily Session Maximal Daily Exercises: Sustained movements are designed to rescale the amplitude of movement output for generalization to daily functional activities. Performed as follows for 1 set of 10 repetitions each: Multi directional sustained movements- 1) Floor to ceiling, 2) Side to side. Multi directional Repetitive movements performed in standing and are designed to provide retraining effort needed for sustained muscle activation in tasks Performed as follows: 3) Step and reach forward, 4) Step and Reach Backwards, 5) Step and reach sideways, 6) Rock and reach forward/backward, 7) Rock and reach sideways (omitted this date due to back pain). Seated exercises required cues and therapist guiding. For standing exercises, patient required minimal to CGA for balance. Progressed to performing exercises in standard version this date. Sit to stand from mat table on lowest setting with cues for weight shift, technique and SBA for 10 reps for 1 set.    Other Exercises 2 Balance activities with Bosu ball in parallel bars with min to CGA for multiple trials.  Patient was seen for balance activities with heel to toe, walking on flat surface line and then advances to raised surface with contact  guard to min assist for balance recovery.  Functional mobility outdoors for 2 trials of 400 feet, winding pathways, slopes with SBA and cues for inplementing BIG principles.                 OT Education - 10/25/15 2102    Education provided Yes   Education Details posture, BIG techniques   Person(s) Educated Spouse;Patient   Methods Explanation;Verbal cues;Demonstration   Comprehension Verbal cues required;Returned demonstration             OT Long Term Goals - 10/19/15 1000      OT LONG TERM GOAL #1   Title Patient will improve gait speed and endurance and be able to walk  1500 feet in 6 minutes to negotiate around the home and community safely in 4 weeks   Baseline 1395 feet at eval   Time 4   Period Weeks   Status On-going     OT LONG TERM GOAL #2   Title Patient will complete HEP for maximal daily exercises with modified independence in 4 weeks    Baseline no current program   Time 4   Period Weeks   Status On-going     OT LONG TERM GOAL #3   Title Patient will transfer from sit to stand without the use of arms safely and independently from a variety of chairs/surfaces in 4 weeks.   Baseline 18 secs 5 times sit to stand   Time 4   Period Weeks   Status On-going     OT LONG TERM GOAL #4   Title Patient will demonstrate rolling in bed with modified independence.   Baseline difficulty at eval   Time 4   Period Weeks   Status On-going     OT LONG TERM GOAL #5   Title Patient will complete printing and signing name on important documents with 100 legibility and no micrographia.   Baseline mild micrographia noted   Time 4   Period Weeks   Status On-going     OT LONG TERM GOAL #6   Title Patient will complete navigating in narrow spaces without evidence of freezing or hesitations.   Time 4   Period Weeks   Status On-going               Plan - 10/25/15 2103    Clinical Impression Statement Patient continues to progress in all areas and continues to benefit from skilled occupational therapy for intensive LSVT big program. He has demonstrated advances in gait, balance, strength, and coordination, to perform daily activities with greater independence and a reduced risk of falls. Continue with advancements in exercises and calibration of movement.   Rehab Potential Good   Clinical Impairments Affecting Rehab Potential progressive disease process, balance   OT Frequency 4x / week   OT Duration 4 weeks   OT Treatment/Interventions Self-care/ADL training;Therapeutic exercise;Functional Mobility Training;Patient/family  education;Neuromuscular education;Balance training;Therapeutic exercises;DME and/or AE instruction;Therapeutic activities;Gait Training;Stair Training   Consulted and Agree with Plan of Care Patient;Family member/caregiver   Family Member Consulted wife, Darel HongJudy      Patient will benefit from skilled therapeutic intervention in order to improve the following deficits and impairments:  Abnormal gait, Improper body mechanics, Decreased activity tolerance, Decreased knowledge of use of DME, Decreased strength, Decreased balance, Decreased mobility, Difficulty walking, Decreased coordination, Pain, Impaired UE functional use  Visit Diagnosis: Unsteadiness on feet  Other lack of coordination  Muscle weakness (generalized)  Difficulty in walking, not elsewhere classified  Problem List There are no active problems to display for this patient.  Kerrie Buffalo, OTR/L, CLT  Gemini Beaumier 10/25/2015, 9:05 PM  [place] [facility] MAIN [facility] SERVICES [address] [place], [place], [address] Phone: [phone]   Fax:  [phone]  Name: Jessee C Miller MRN: [id number] Date of Birth: [birth year]

## 2015-10-25 NOTE — Therapy (Signed)
[place] [facility] [address] , [place], [address] Phone: [phone]   Fax:  [phone]  Occupational Therapy Treatment  Patient Details  Name: Derrick C Sw[place] MRN: [id number] Date of Birth: 1940-06-13 No Data Recorded  Encounter Date: 10/24/2015      OT End of Session - 10/25/15 1702    Visit Number 13   Number of Visits 17   Date for OT Re-Evaluation 11/10/15   Authorization Type Medicare G code 13   OT Start Time 1105   OT Stop Time 1203   OT Time Calculation (min) 58 min   Activity Tolerance Patient tolerated treatment well   Behavior During Therapy [facility] for tasks assessed/performed      Past Medical History:  Diagnosis Date  . Parkinson disease (HCC)     No past surgical history on file.  There were no vitals filed for this visit.      Subjective Assessment - 10/25/15 2053    Subjective  Patient reports he had a good week and did exercises every day. He does report increased back pain 4 of 10 and feels it may be due to performing yardwork this weekend.   Patient is accompained by: Family member   Pertinent History Patient was evaluated last month however, start date was delayed due to therapist on medical leave and patient preferred to wait to work with this particular therapist.   Patient Stated Goals Patient wants to be as independent as possible. "Walk better"   Currently in Pain? Yes   Pain Score 4    Pain Location Back   Pain Orientation Lower   Pain Descriptors / Indicators Aching   Pain Type Chronic pain   Pain Onset 1 to 4 weeks ago   Pain Frequency Constant   Multiple Pain Sites No                      OT Treatments/Exercises (OP) - 10/25/15 2054      ADLs   ADL Comments Functional component tasks as follows; sit to stand, rolling in bed, turning without hesitation, initiation of stepping, managing curbs, performed with verbal cues and minimal assist verbal and tactile  cues provided. Patient reports he is working at home on bed mobility. Postural stretch at the wall with cues for technique for 5 reps for 10-30 secs each. reciprocal toe tapping and stair negotiation with SBA and cues.      Exercises   Exercises Neurological Re-education     Neurological Re-education Exercises   Other Exercises 1 Patient seen for instruction of LSVT BIG exercises: LSVT Daily Session Maximal Daily Exercises: Sustained movements are designed to rescale the amplitude of movement output for generalization to daily functional activities. Performed as follows for 1 set of 10 repetitions each: Multi directional sustained movements- 1) Floor to ceiling, 2) Side to side. Multi directional Repetitive movements performed in standing and are designed to provide retraining effort needed for sustained muscle activation in tasks Performed as follows: 3) Step and reach forward, 4) Step and Reach Backwards, 5) Step and reach sideways, 6) Rock and reach forward/backward, 7) Rock and reach sideways (omitted this date due to back pain). Seated exercises required cues and therapist guiding. For standing exercises, patient required minimal to CGA for balance. Progressed to performing exercises in standard version this date. Sit to stand from mat table on lowest setting with cues for weight shift, technique and SBA for 10 reps for 1  set.    Other Exercises 2 Patient was seen for balance activities with heel to toe, walking on flat surface line and then advances to raised surface with contact guard to min assist for balance recovery. Functional mobility for 500 hundred feet for two rounds with standby assist and occasional cues for reciprocal arm swing, big stamps, and posture. Patient performing sit to stand from unstable surface of swing with use of arms and cues for technique.                OT Education - 10/25/15 2055    Education provided Yes   Education Details HEP   Person(s) Educated Patient    Methods Explanation;Demonstration;Verbal cues   Comprehension Verbal cues required;Returned demonstration;Verbalized understanding             OT Long Term Goals - 10/19/15 1000      OT LONG TERM GOAL #1   Title Patient will improve gait speed and endurance and be able to walk 1500 feet in 6 minutes to negotiate around the home and community safely in 4 weeks   Baseline 1395 feet at eval   Time 4   Period Weeks   Status On-going     OT LONG TERM GOAL #2   Title Patient will complete HEP for maximal daily exercises with modified independence in 4 weeks    Baseline no current program   Time 4   Period Weeks   Status On-going     OT LONG TERM GOAL #3   Title Patient will transfer from sit to stand without the use of arms safely and independently from a variety of chairs/surfaces in 4 weeks.   Baseline 18 secs 5 times sit to stand   Time 4   Period Weeks   Status On-going     OT LONG TERM GOAL #4   Title Patient will demonstrate rolling in bed with modified independence.   Baseline difficulty at eval   Time 4   Period Weeks   Status On-going     OT LONG TERM GOAL #5   Title Patient will complete printing and signing name on important documents with 100 legibility and no micrographia.   Baseline mild micrographia noted   Time 4   Period Weeks   Status On-going     OT LONG TERM GOAL #6   Title Patient will complete navigating in narrow spaces without evidence of freezing or hesitations.   Time 4   Period Weeks   Status On-going               Plan - 10/25/15 1702    Clinical Impression Statement Patient has continued to progress with maximal daily exercises and now able to complete without the use of a chair and contact guard to standby assist. He has advanced to unstable surfaces with sit to stand transitions. He continues to demonstrate difficulty with distractions and demonstrates occasional hesitations and freezing of gait, usually with initiation  of movement.  Will continue to work on adding distractions, calibration of movement, and advancing to increasingly difficult tasks.   Rehab Potential Good   Clinical Impairments Affecting Rehab Potential progressive disease process, balance   OT Frequency 4x / week   OT Duration 4 weeks   OT Treatment/Interventions Self-care/ADL training;Therapeutic exercise;Functional Mobility Training;Patient/family education;Neuromuscular education;Balance training;Therapeutic exercises;DME and/or AE instruction;Therapeutic activities;Gait Training;Stair Training   Consulted and Agree with Plan of Care Patient;Family member/caregiver   Family Member Consulted wife, Darel Hong  Patient will benefit from skilled therapeutic intervention in order to improve the following deficits and impairments:  Abnormal gait, Improper body mechanics, Decreased activity tolerance, Decreased knowledge of use of DME, Decreased strength, Decreased balance, Decreased mobility, Difficulty walking, Decreased coordination, Pain, Impaired UE functional use  Visit Diagnosis: Unsteadiness on feet  Other lack of coordination  Muscle weakness (generalized)  Difficulty in walking, not elsewhere classified    Problem List There are no active problems to display for this patient.  Kerrie Buffalo, OTR/L, CLT  Bassem Bernasconi 10/25/2015, 8:56 PM  [place] [facility] MAIN [facility] SERVICES [address] [place], [place], [address] Phone: [phone]   Fax:  [phone]  Name: Daron C Miller MRN: [id number] Date of Birth: 1940/09/17

## 2015-10-26 ENCOUNTER — Ambulatory Visit: Payer: Medicare Other | Admitting: Occupational Therapy

## 2015-10-26 DIAGNOSIS — R2681 Unsteadiness on feet: Secondary | ICD-10-CM

## 2015-10-26 DIAGNOSIS — M6281 Muscle weakness (generalized): Secondary | ICD-10-CM

## 2015-10-26 DIAGNOSIS — R278 Other lack of coordination: Secondary | ICD-10-CM

## 2015-10-26 DIAGNOSIS — R262 Difficulty in walking, not elsewhere classified: Secondary | ICD-10-CM

## 2015-10-27 ENCOUNTER — Encounter: Payer: Self-pay | Admitting: Occupational Therapy

## 2015-10-27 ENCOUNTER — Ambulatory Visit: Payer: Medicare Other | Admitting: Occupational Therapy

## 2015-10-27 DIAGNOSIS — R278 Other lack of coordination: Secondary | ICD-10-CM

## 2015-10-27 DIAGNOSIS — R2681 Unsteadiness on feet: Secondary | ICD-10-CM

## 2015-10-27 DIAGNOSIS — R262 Difficulty in walking, not elsewhere classified: Secondary | ICD-10-CM

## 2015-10-27 DIAGNOSIS — M6281 Muscle weakness (generalized): Secondary | ICD-10-CM

## 2015-10-27 NOTE — Therapy (Signed)
[place] MAIN [facility] SERVICES [address] [place], [place], [address] Phone: [phone]   Fax:  [phone]  Occupational Therapy Treatment/Discharge Summary  Patient Details  Name: Derrick Miller MRN: [id number] Date of Birth: June 13, 1940 No Data Recorded  Encounter Date: 10/27/2015      OT End of Session - 10/27/15 1522    Visit Number 16   Number of Visits 17   Date for OT Re-Evaluation 11/10/15   Authorization Type Medicare G code 16   OT Start Time 1100   OT Stop Time 1205   OT Time Calculation (min) 65 min   Activity Tolerance Patient tolerated treatment well   Behavior During Therapy [facility] for tasks assessed/performed      Past Medical History:  Diagnosis Date  . Parkinson disease ([place])     History reviewed. No pertinent surgical history.  There were no vitals filed for this visit.      Subjective Assessment - 10/27/15 1510    Subjective  Patient reports he is ready for discharge, feels comfortable with continuing with his home exercise program at home and wants to possibly go to the gym for silver sneakers program and also use the fitness machines.     Patient is accompained by: Family member   Patient Stated Goals Patient wants to be as independent as possible. "Walk better"   Currently in Pain? No/denies   Pain Score 0-No pain                      OT Treatments/Exercises (OP) - 10/27/15 1514      ADLs   ADL Comments Functional component tasks as follows; sit to stand, rolling in bed, turning without hesitation, initiation of stepping, managing curbs, performed with modified independence. Postural stretch at the wall with cues for technique for 5 reps for 10-30 secs each. reciprocal toe tapping and stair negotiation     Neurological Re-education Exercises   Other Exercises 1 Patient seen for instruction of LSVT BIG exercises: LSVT Daily Session Maximal Daily Exercises: Sustained movements are designed  to rescale the amplitude of movement output for generalization to daily functional activities. Performed as follows for 1 set of 10 repetitions each: Multi directional sustained movements- 1) Floor to ceiling, 2) Side to side. Multi directional Repetitive movements performed in standing and are designed to provide retraining effort needed for sustained muscle activation in tasks Performed as follows: 3) Step and reach forward, 4) Step and Reach Backwards, 5) Step and reach sideways, 6) Rock and reach forward/backward, 7) Rock and reach sideways (omitted this date due to back pain). Performing exercises in standard version. Sit to stand from mat table on lowest setting with modified independence.  Patient independent with his exercises, wife also able to demo understanding of exercises.  Instructed on hierarchy of progressing his exercises at home if he starts to feel they are becoming too easy.  Both demo understanding and written handout issued outlining all steps.    Other Exercises 2 Reassessment as follows:  6 minute walk test 1560 feet, 5 times sit to stand 14 sec.  BERG balance test 53/56, freezing of gait 6.                OT Education - 10/27/15 1521    Education provided Yes   Education Details maximal daily exercises, discharge plan, goals, hierarchy of exercises to advance    Person(s) Educated Patient;Spouse   Methods Explanation;Demonstration   Comprehension  Verbalized understanding;Returned demonstration             OT Long Term Goals - 10/27/15 1528      OT LONG TERM GOAL #1   Title Patient will improve gait speed and endurance and be able to walk 1500 feet in 6 minutes to negotiate around the home and community safely in 4 weeks   Baseline 1395 feet at eval, 1560 at discharge   Time 4   Period Weeks   Status Achieved     OT LONG TERM GOAL #2   Title Patient will complete HEP for maximal daily exercises with modified independence in 4 weeks    Baseline no  current program   Time 4   Period Weeks   Status Achieved     OT LONG TERM GOAL #3   Title Patient will transfer from sit to stand without the use of arms safely and independently from a variety of chairs/surfaces in 4 weeks.   Baseline 18 secs 5 times sit to stand   Time 4   Period Weeks   Status Achieved     OT LONG TERM GOAL #4   Title Patient will demonstrate rolling in bed with modified independence.   Baseline difficulty at eval   Time 4   Period Weeks   Status Achieved     OT LONG TERM GOAL #5   Title Patient will complete printing and signing name on important documents with 100 legibility and no micrographia.   Baseline mild micrographia noted   Time 4   Period Weeks   Status Achieved     OT LONG TERM GOAL #6   Title Patient will complete navigating in narrow spaces without evidence of freezing or hesitations.   Time 4   Period Weeks   Status Achieved               Plan - 10/27/15 1522    Clinical Impression Statement Patient has made excellent progress with intensive LSVT BIG program, his 6 minute walk test improved from 1395 feet to 1560 feet, his BERG balance score increased to reduce fall risk and his freezing behaviors have lessened in frequency and time.  He has not had any falls since beginning the program. He demonstrates improved reciprocal arm swing and amplitude of gait and functional movements. Patient is now able to complete the standard version of exercises with modified independence and is aware he will need to continue with exercises daily, minimum of one time a day.  He met all goals set at evaluation and he and his wife are pleased with his progress.  He may need additional therapy in the future depending on the progression with his disease process. He was a pleasure to work with!    Rehab Potential Good   OT Frequency 4x / week   OT Duration 4 weeks   OT Treatment/Interventions Self-care/ADL training;Therapeutic exercise;Functional Mobility  Training;Patient/family education;Neuromuscular education;Balance training;Therapeutic exercises;DME and/or AE instruction;Therapeutic activities;Gait Training;Stair Training   Consulted and Agree with Plan of Care Patient;Family member/caregiver   Family Member Consulted wife, Bethena Roys      Patient will benefit from skilled therapeutic intervention in order to improve the following deficits and impairments:  Abnormal gait, Improper body mechanics, Decreased activity tolerance, Decreased knowledge of use of DME, Decreased strength, Decreased balance, Decreased mobility, Difficulty walking, Decreased coordination, Pain, Impaired UE functional use  Visit Diagnosis: Unsteadiness on feet  Other lack of coordination  Muscle weakness (generalized)  Difficulty in walking, not  elsewhere classified      G-Codes - 2015/11/25 1530    Functional Assessment Tool Used clinical judgment, 6 minute walk test, 5 times sit to stand   Functional Limitation Mobility: Walking and moving around   Mobility: Walking and Moving Around Goal Status [phone]) At least 1 percent but less than 20 percent impaired, limited or restricted   Mobility: Walking and Moving Around Discharge Status [phone]) At least 1 percent but less than 20 percent impaired, limited or restricted      Problem List There are no active problems to display for this patient.  Achilles Dunk, OTR/L, CLT  Efrain Clauson 2015/11/25, 3:31 PM  [place] MAIN [facility] SERVICES [address] [place], [place], [address] Phone: [phone]   Fax:  [phone]  Name: Derrick Miller MRN: [id number] Date of Birth: 01-Sep-1940

## 2015-10-27 NOTE — Therapy (Signed)
[place] [facility] MAIN [facility] SERVICES [address] [place], [place], [address] Phone: [phone]   Fax:  [phone]  Occupational Therapy Treatment  Patient Details  Name: Derrick Miller MRN: [id number] Date of Birth: 04-Jan-1941 No Data Recorded  Encounter Date: 10/26/2015      OT End of Session - 10/27/15 0910    Visit Number 15   Number of Visits 17   Date for OT Re-Evaluation 11/10/15   Authorization Type Medicare G code 14   OT Start Time 1101   OT Stop Time 1206   OT Time Calculation (min) 65 min   Activity Tolerance Patient tolerated treatment well   Behavior During Therapy [facility] for tasks assessed/performed      Past Medical History:  Diagnosis Date  . Parkinson disease ([facility])     History reviewed. No pertinent surgical history.  There were no vitals filed for this visit.      Subjective Assessment - 10/27/15 0907    Subjective  Patient reports he would like to transition to exercising in the gym here for silver sneakers but needs to be cleared first and any recommendations made. He is feeling better about performing his daily exercises, no pain noted this date.    Patient is accompained by: Family member   Patient Stated Goals Patient wants to be as independent as possible. "Walk better"   Currently in Pain? No/denies   Pain Score 0-No pain                      OT Treatments/Exercises (OP) - 10/27/15 1445      ADLs   ADL Comments Functional component tasks as follows; sit to stand, rolling in bed, turning without hesitation, initiation of stepping, managing curbs, performed with verbal cues and minimal assist verbal and tactile cues provided. Patient reports he is working at home on bed mobility. Postural stretch at the wall with cues for technique for 5 reps for 10-30 secs each. reciprocal toe tapping and stair negotiation with SBA and cues.      Neurological Re-education Exercises   Other Exercises 1 Patient  seen for instruction of LSVT BIG exercises: LSVT Daily Session Maximal Daily Exercises: Sustained movements are designed to rescale the amplitude of movement output for generalization to daily functional activities. Performed as follows for 1 set of 10 repetitions each: Multi directional sustained movements- 1) Floor to ceiling, 2) Side to side. Multi directional Repetitive movements performed in standing and are designed to provide retraining effort needed for sustained muscle activation in tasks Performed as follows: 3) Step and reach forward, 4) Step and Reach Backwards, 5) Step and reach sideways, 6) Rock and reach forward/backward, 7) Rock and reach sideways (omitted this date due to back pain). Performing exercises in standard version. Sit to stand from mat table on lowest setting with modified independence.   Other Exercises 2 Patient seen for assessement of balance on cardio machines in the fitness gym, instruction on safe techniques to get on and off and how to operate machines along with resistance recommendations.                 OT Education - 10/27/15 0909    Education provided Yes   Education Details HEP, gym exercises for reciprocal arm and leg movements on variety of equipment.    Person(s) Educated Patient;Spouse   Methods Explanation;Demonstration;Verbal cues   Comprehension Verbal cues required;Returned demonstration;Verbalized understanding  OT Long Term Goals - 10/19/15 1000      OT LONG TERM GOAL #1   Title Patient will improve gait speed and endurance and be able to walk 1500 feet in 6 minutes to negotiate around the home and community safely in 4 weeks   Baseline 1395 feet at eval   Time 4   Period Weeks   Status On-going     OT LONG TERM GOAL #2   Title Patient will complete HEP for maximal daily exercises with modified independence in 4 weeks    Baseline no current program   Time 4   Period Weeks   Status On-going     OT LONG TERM  GOAL #3   Title Patient will transfer from sit to stand without the use of arms safely and independently from a variety of chairs/surfaces in 4 weeks.   Baseline 18 secs 5 times sit to stand   Time 4   Period Weeks   Status On-going     OT LONG TERM GOAL #4   Title Patient will demonstrate rolling in bed with modified independence.   Baseline difficulty at eval   Time 4   Period Weeks   Status On-going     OT LONG TERM GOAL #5   Title Patient will complete printing and signing name on important documents with 100 legibility and no micrographia.   Baseline mild micrographia noted   Time 4   Period Weeks   Status On-going     OT LONG TERM GOAL #6   Title Patient will complete navigating in narrow spaces without evidence of freezing or hesitations.   Time 4   Period Weeks   Status On-going               Plan - 10/27/15 0910    Clinical Impression Statement Patient has made excellent progress, plan for next session to be his last session and will discharge patient.  Will perform outcome measures next date.  Patient can transition to silver sneakers program and recommend the nu step and bike as a primary exercise in the gym and he may perform the elliptical and treadmill with supervision.     Rehab Potential Good   Clinical Impairments Affecting Rehab Potential progressive disease process, balance   OT Frequency 4x / week   OT Duration 4 weeks   OT Treatment/Interventions Self-care/ADL training;Therapeutic exercise;Functional Mobility Training;Patient/family education;Neuromuscular education;Balance training;Therapeutic exercises;DME and/or AE instruction;Therapeutic activities;Gait Training;Stair Training   Consulted and Agree with Plan of Care Patient;Family member/caregiver   Family Member Consulted wife, Darel HongJudy      Patient will benefit from skilled therapeutic intervention in order to improve the following deficits and impairments:  Abnormal gait, Improper body  mechanics, Decreased activity tolerance, Decreased knowledge of use of DME, Decreased strength, Decreased balance, Decreased mobility, Difficulty walking, Decreased coordination, Pain, Impaired UE functional use  Visit Diagnosis: Unsteadiness on feet  Other lack of coordination  Muscle weakness (generalized)  Difficulty in walking, not elsewhere classified    Problem List There are no active problems to display for this patient.  Kerrie Buffalomy T Crayton Savarese, OTR/L, CLT  Kaleigh Spiegelman 10/27/2015, 2:51 PM  [place] [facility] MAIN [facility] [address] [place], [place], [address] Phone: [phone]   Fax:  [phone]  Name: Derrick Miller MRN: [id number] Date of Birth: [birth year]

## 2015-10-30 ENCOUNTER — Ambulatory Visit: Payer: Medicare Other | Admitting: Occupational Therapy

## 2016-10-28 ENCOUNTER — Other Ambulatory Visit: Payer: Self-pay | Admitting: Internal Medicine

## 2016-10-28 DIAGNOSIS — R6889 Other general symptoms and signs: Secondary | ICD-10-CM

## 2016-10-28 DIAGNOSIS — I739 Peripheral vascular disease, unspecified: Secondary | ICD-10-CM

## 2016-10-31 ENCOUNTER — Ambulatory Visit
Admission: RE | Admit: 2016-10-31 | Discharge: 2016-10-31 | Disposition: A | Discharge status: Home / Self Care | Payer: Medicare Other | Source: Ambulatory Visit | Attending: Internal Medicine | Admitting: Internal Medicine

## 2016-10-31 DIAGNOSIS — I70208 Unspecified atherosclerosis of native arteries of extremities, other extremity: Secondary | ICD-10-CM | POA: Diagnosis not present

## 2016-10-31 DIAGNOSIS — K5790 Diverticulosis of intestine, part unspecified, without perforation or abscess without bleeding: Secondary | ICD-10-CM | POA: Insufficient documentation

## 2016-10-31 DIAGNOSIS — R6889 Other general symptoms and signs: Secondary | ICD-10-CM | POA: Diagnosis not present

## 2016-10-31 DIAGNOSIS — N281 Cyst of kidney, acquired: Secondary | ICD-10-CM | POA: Diagnosis not present

## 2016-10-31 DIAGNOSIS — I739 Peripheral vascular disease, unspecified: Secondary | ICD-10-CM | POA: Diagnosis present

## 2016-10-31 DIAGNOSIS — I700 Atherosclerosis of aorta: Secondary | ICD-10-CM | POA: Diagnosis not present

## 2016-10-31 MED ORDER — IOPAMIDOL (ISOVUE-370) INJECTION 76%
125.0000 mL | Freq: Once | INTRAVENOUS | Status: AC | PRN
  Administered 2016-10-31: 125 mL via INTRAVENOUS

## 2016-12-03 ENCOUNTER — Encounter (INDEPENDENT_AMBULATORY_CARE_PROVIDER_SITE_OTHER): Payer: Self-pay | Admitting: Vascular Surgery

## 2016-12-03 ENCOUNTER — Ambulatory Visit (INDEPENDENT_AMBULATORY_CARE_PROVIDER_SITE_OTHER): Payer: Medicare Other | Admitting: Vascular Surgery

## 2016-12-03 VITALS — BP 139/83 | HR 62 | Resp 16 | Ht 70.0 in | Wt 169.6 lb

## 2016-12-03 DIAGNOSIS — M79605 Pain in left leg: Secondary | ICD-10-CM

## 2016-12-03 DIAGNOSIS — I739 Peripheral vascular disease, unspecified: Secondary | ICD-10-CM | POA: Diagnosis not present

## 2016-12-03 DIAGNOSIS — M79609 Pain in unspecified limb: Secondary | ICD-10-CM | POA: Insufficient documentation

## 2016-12-03 DIAGNOSIS — G20 Parkinson's disease: Secondary | ICD-10-CM | POA: Diagnosis not present

## 2016-12-03 DIAGNOSIS — M79604 Pain in right leg: Secondary | ICD-10-CM

## 2016-12-03 DIAGNOSIS — G20A1 Parkinson's disease without dyskinesia, without mention of fluctuations: Secondary | ICD-10-CM | POA: Insufficient documentation

## 2016-12-03 NOTE — Patient Instructions (Signed)

## 2016-12-03 NOTE — Assessment & Plan Note (Signed)
Given the location of the pain and the character of the pain particularly with his above findings on the study, I doubt that pain is related to poor perfusion.  This is likely musculoskeletal in nature.  We will be following his perfusion and he says the pain is not currently debilitating.

## 2016-12-03 NOTE — Assessment & Plan Note (Signed)
As part of his workup he has undergone both ABIs and a CT scan that I have independently reviewed.  His ABIs show noncompressible vessels in either lower extremity but his waveforms are actually pretty good.  His CT scan demonstrates very mild aortoiliac atherosclerosis with no significant stenosis proximally.  His common femoral arteries and superficial femoral arteries are also patent.  He does have significant calcification of his tibial vessels and appears to have at least some degree of stenosis in the anterior tibial arteries, but overall his blood flow to the foot remains reasonably well preserved. We had a long discussion today regarding the pathophysiology and natural history of peripheral arterial disease.  He is already on Coumadin and an 81 mg aspirin daily.  No intervention would currently be required for his isolated tibial disease without limb threatening symptoms.  We will plan to follow this and I will see him back in about 6 months with noninvasive studies including arterial duplex and ABIs.

## 2016-12-03 NOTE — Progress Notes (Signed)
Patient ID: Derrick Miller, male   DOB: 05-20-1940, 76 y.o.   MRN: [id number]  Chief Complaint  Patient presents with  . New Patient (Initial Visit)    ref Graciela Husbands PVD    HPI Derrick Miller is a 76 y.o. male.  I am asked to see the patient by Dr. Graciela Husbands for evaluation of PAD.  The patient reports some tightness and pain in his knee and upper calf area.  This is sometimes related to activity but not reliably so.  He denies any nonhealing ulcerations or infection.  Denies symptoms that sound worrisome for ischemia rest pain.  As part of his workup he has undergone both ABIs and a CT scan that I have independently reviewed.  His ABIs show noncompressible vessels in either lower extremity but his waveforms are actually pretty good.  His CT scan demonstrates very mild aortoiliac atherosclerosis with no significant stenosis proximally.  His common femoral arteries and superficial femoral arteries are also patent.  He does have significant calcification of his tibial vessels and appears to have at least some degree of stenosis in the anterior tibial arteries, but overall his blood flow to the foot remains reasonably well preserved.  Past Medical History:  Diagnosis Date  . Parkinson disease [facility])     Past Surgical History:  Procedure Laterality Date  . CARDIAC SURGERY      Family History No bleeding disorders, clotting disorders, autoimmune disease or aneurysms  Social History Social History  Substance Use Topics  . Smoking status: Never Smoker  . Smokeless tobacco: Never Used  . Alcohol use Yes  No IVDU  Allergies  Allergen Reactions  . Lodine [Etodolac]   . Naproxen     Current Outpatient Prescriptions  Medication Sig Dispense Refill  . aspirin EC 81 MG tablet Take by mouth.    Marland Kitchen atenolol (TENORMIN) 25 MG tablet   1  . carbidopa-levodopa (SINEMET CR) 50-200 MG tablet   10  . Misc Natural Products (TURMERIC CURCUMIN) CAPS Take by mouth daily.    . Omega-3 Fatty Acids (FISH  OIL) 1000 MG CPDR Take by mouth.    Marland Kitchen omeprazole (PRILOSEC) 20 MG capsule   0  . thiamine (VITAMIN B-1) 100 MG tablet Take 100 mg by mouth daily.    . vitamin C (ASCORBIC ACID) 500 MG tablet Take 500 mg by mouth daily.    Marland Kitchen warfarin (COUMADIN) 1 MG tablet TAKE 1 and 1/2 TABLETS BY MOUTH ONCE DAILY    . warfarin (COUMADIN) 7.5 MG tablet TAKE 1 TABLET BY MOUTH ONCE DAILY.     No current facility-administered medications for this visit.       REVIEW OF SYSTEMS (Negative unless checked)  Constitutional: Weight loss  Fever  Chills Cardiac: Chest pain   Chest pressure   Palpitations   Shortness of breath when laying flat   Shortness of breath at rest   Shortness of breath with exertion. Vascular:  Pain in legs with walking   Pain in legs at rest   Pain in legs when laying flat   Claudication   Pain in feet when walking  Pain in feet at rest  Pain in feet when laying flat   History of DVT   Phlebitis   Swelling in legs   Varicose veins   Non-healing ulcers Pulmonary:   Uses home oxygen   Productive cough   Hemoptysis   Wheeze  COPD   Asthma Neurologic:  Dizziness  Blackouts     Seizures   History of stroke   History of TIA  Aphasia   Temporary blindness   Dysphagia   Weakness or numbness in arms   Weakness or numbness in legs Musculoskeletal:  Arthritis   Joint swelling   Joint pain   Low back pain Hematologic:  Easy bruising  Easy bleeding   Hypercoagulable state   Anemic  Hepatitis Gastrointestinal:  Blood in stool   Vomiting blood  Gastroesophageal reflux/heartburn   Abdominal pain Genitourinary:  Chronic kidney disease   Difficult urination  Frequent urination  Burning with urination   Hematuria Skin:  Rashes   Ulcers   Wounds Psychological:  History of anxiety    History of major depression.    Physical Exam BP 139/83 (BP Location: Right Arm)   Pulse 62   Resp 16    Ht  (1.778 m)   Wt 169 lb 9.6 oz (76.9 kg)   BMI 24.34 kg/m  Gen:  WD/WN, NAD Head: [place]/AT, No temporalis wasting. Ear/Nose/Throat: Hearing grossly intact, nares w/o erythema or drainage, oropharynx w/o Erythema/Exudate Eyes: Conjunctiva clear, sclera non-icteric  Neck: trachea midline.  No bruit or JVD.  Pulmonary:  Good air movement, clear to auscultation bilaterally.  Cardiac: irregular Vascular:  Vessel Right Left  Radial Palpable Palpable                          PT Trace Palpable Palpable  DP Palpable Palpable   Gastrointestinal: soft, non-tender/non-distended.  Musculoskeletal: M/S 5/5 throughout.  Extremities without ischemic changes.  No deformity or atrophy. No edema. Mild rigidity present Neurologic: Sensation grossly intact in extremities.  Symmetrical.  Speech is fluent but a little slow. Motor exam as listed above. Psychiatric: Judgment intact, Mood & affect appropriate for pt's clinical situation. Dermatologic: No rashes or ulcers noted.  No cellulitis or open wounds.    Radiology No results found.  Labs No results found for this or any previous visit (from the past 2160 hour(s)).  Assessment/Plan:  Parkinson disease (HCC) Sounds reasonably well treated.  He could have some rigidity in his muscles that is with Parkinson's disease, but I do not expect that to be a debilitating issue.  Pain in limb Given the location of the pain and the character of the pain particularly with his above findings on the study, I doubt that pain is related to poor perfusion.  This is likely musculoskeletal in nature.  We will be following his perfusion and he says the pain is not currently debilitating.  PVD (peripheral vascular disease) (HCC) As part of his workup he has undergone both ABIs and a CT scan that I have independently reviewed.  His ABIs show noncompressible vessels in either lower extremity but his waveforms are actually pretty good.  His CT scan demonstrates  very mild aortoiliac atherosclerosis with no significant stenosis proximally.  His common femoral arteries and superficial femoral arteries are also patent.  He does have significant calcification of his tibial vessels and appears to have at least some degree of stenosis in the anterior tibial arteries, but overall his blood flow to the foot remains reasonably well preserved. We had a long discussion today regarding the pathophysiology and natural history of peripheral arterial disease.  He is already on Coumadin and an 81 mg aspirin daily.  No intervention would currently be required for his isolated tibial disease without limb threatening symptoms.  We will plan to follow this and I will see him back  in about 6 months with noninvasive studies including arterial duplex and ABIs.      Festus Barren 12/03/2016, 4:21 PM   This note was created with Dragon medical transcription system.  Any errors from dictation are unintentional.

## 2016-12-03 NOTE — Assessment & Plan Note (Signed)
Sounds reasonably well treated.  He could have some rigidity in his muscles that is with Parkinson's disease, but I do not expect that to be a debilitating issue.

## 2017-06-06 ENCOUNTER — Ambulatory Visit (INDEPENDENT_AMBULATORY_CARE_PROVIDER_SITE_OTHER): Payer: Medicare Other | Admitting: Vascular Surgery

## 2017-06-06 ENCOUNTER — Encounter (INDEPENDENT_AMBULATORY_CARE_PROVIDER_SITE_OTHER): Payer: Medicare Other

## 2017-07-08 ENCOUNTER — Ambulatory Visit (INDEPENDENT_AMBULATORY_CARE_PROVIDER_SITE_OTHER): Payer: Medicare Other

## 2017-07-08 ENCOUNTER — Encounter (INDEPENDENT_AMBULATORY_CARE_PROVIDER_SITE_OTHER): Payer: Self-pay | Admitting: Vascular Surgery

## 2017-07-08 ENCOUNTER — Ambulatory Visit (INDEPENDENT_AMBULATORY_CARE_PROVIDER_SITE_OTHER): Payer: Medicare Other | Admitting: Vascular Surgery

## 2017-07-08 VITALS — BP 157/83 | HR 61 | Resp 16 | Ht 70.0 in | Wt 170.0 lb

## 2017-07-08 DIAGNOSIS — I739 Peripheral vascular disease, unspecified: Secondary | ICD-10-CM

## 2017-07-08 DIAGNOSIS — G20 Parkinson's disease: Secondary | ICD-10-CM | POA: Diagnosis not present

## 2017-07-08 NOTE — Patient Instructions (Signed)

## 2017-07-08 NOTE — Progress Notes (Signed)
MRN : [id number]  Derrick Miller is a 77 y.o. ([birth year]) male who presents with chief complaint of  Chief Complaint  Patient presents with  . Follow-up    34mo abi,bil arterial  .  History of Present Illness: Patient returns today in follow up of peripheral arterial disease.  He is doing well without any lifestyle limiting claudication, ischemic rest pain, or ulceration at this time.  He reports no major changes or problems since his last visit.  His noninvasive studies today show good triphasic waveforms bilaterally with excellent pulsatile waveforms and nearly normal digital pressures bilaterally.  Current Outpatient Medications  Medication Sig Dispense Refill  . aspirin EC 81 MG tablet Take by mouth.    Marland Kitchen atenolol (TENORMIN) 25 MG tablet   1  . carbidopa-levodopa (SINEMET CR) 50-200 MG tablet   10  . Misc Natural Products (TURMERIC CURCUMIN) CAPS Take by mouth daily.    . Omega-3 Fatty Acids (FISH OIL) 1000 MG CPDR Take by mouth.    Marland Kitchen omeprazole (PRILOSEC) 20 MG capsule   0  . tamsulosin (FLOMAX) 0.4 MG CAPS capsule Take by mouth.    . thiamine (VITAMIN B-1) 100 MG tablet Take 100 mg by mouth daily.    . vitamin C (ASCORBIC ACID) 500 MG tablet Take 500 mg by mouth daily.    Marland Kitchen warfarin (COUMADIN) 1 MG tablet TAKE 1 and 1/2 TABLETS BY MOUTH ONCE DAILY    . warfarin (COUMADIN) 7.5 MG tablet TAKE 1 TABLET BY MOUTH ONCE DAILY.     No current facility-administered medications for this visit.     Past Medical History:  Diagnosis Date  . Parkinson disease [facility])     Past Surgical History:  Procedure Laterality Date  . CARDIAC SURGERY      Family History No bleeding disorders, clotting disorders, autoimmune disease or aneurysms  Social History     Social History  Substance Use Topics  . Smoking status: Never Smoker  . Smokeless tobacco: Never Used  . Alcohol use Yes  No IVDU      Allergies  Allergen Reactions  . Lodine [Etodolac]   . Naproxen     REVIEW OF  SYSTEMS (Negative unless checked)  Constitutional: Weight loss  Fever  Chills Cardiac: Chest pain   Chest pressure   Palpitations   Shortness of breath when laying flat   Shortness of breath at rest   Shortness of breath with exertion. Vascular:  Pain in legs with walking   Pain in legs at rest   Pain in legs when laying flat   Claudication   Pain in feet when walking  Pain in feet at rest  Pain in feet when laying flat   History of DVT   Phlebitis   Swelling in legs   Varicose veins   Non-healing ulcers Pulmonary:   Uses home oxygen   Productive cough   Hemoptysis   Wheeze  COPD   Asthma Neurologic:  Dizziness  Blackouts   Seizures   History of stroke   History of TIA  Aphasia   Temporary blindness   Dysphagia   Weakness or numbness in arms   Weakness or numbness in legs Musculoskeletal:  Arthritis   Joint swelling   Joint pain   Low back pain Hematologic:  Easy bruising  Easy bleeding   Hypercoagulable state   Anemic  Hepatitis Gastrointestinal:  Blood in stool   Vomiting blood  Gastroesophageal reflux/heartburn   Abdominal pain Genitourinary:  Chronic  kidney disease   Difficult urination  Frequent urination  Burning with urination   Hematuria Skin:  Rashes   Ulcers   Wounds Psychological:  History of anxiety    History of major depression.   Physical Examination  BP (!) 157/83 (BP Location: Right Arm)   Pulse 61   Resp 16   Ht  (1.778 m)   Wt 170 lb (77.1 kg)   BMI 24.39 kg/m  Gen:  WD/WN, NAD.  Appears younger than stated age Head: [place]/AT, No temporalis wasting. Ear/Nose/Throat: Hearing grossly intact, nares w/o erythema or drainage Eyes: Conjunctiva clear. Sclera non-icteric Neck: Supple.  Trachea midline Pulmonary:  Good air movement, no use of accessory muscles.  Cardiac: RRR, no JVD Vascular:  Vessel Right Left  Radial Palpable Palpable                            PT Palpable Palpable  DP Palpable Palpable    Musculoskeletal: M/S 5/5 throughout.  No deformity or atrophy. Neurologic: Sensation grossly intact in extremities.  Symmetrical.  Speech is fluent.  Psychiatric: Judgment intact, Mood & affect appropriate for pt's clinical situation. Dermatologic: No rashes or ulcers noted.  No cellulitis or open wounds.       Labs No results found for this or any previous visit (from the past 2160 hour(s)).  Radiology No results found.  Assessment/Plan Parkinson disease (HCC) Sounds reasonably well treated.  He could have some rigidity in his muscles that is with Parkinson's disease, but I do not expect that to be a debilitating issue.   PVD (peripheral vascular disease) (HCC) His noninvasive studies today show good triphasic waveforms bilaterally with excellent pulsatile waveforms and nearly normal digital pressures bilaterally. He has calcific disease previously seen on a CT angiogram last year.  At current, his flow is nearly normal.  No intervention would be recommended.  Recheck in 1 year with ABIs.  Continue current medical regimen.    Festus Barren, MD  07/08/2017 11:39 AM    This note was created with Dragon medical transcription system.  Any errors from dictation are purely unintentional

## 2017-07-08 NOTE — Assessment & Plan Note (Signed)
His noninvasive studies today show good triphasic waveforms bilaterally with excellent pulsatile waveforms and nearly normal digital pressures bilaterally. He has calcific disease previously seen on a CT angiogram last year.  At current, his flow is nearly normal.  No intervention would be recommended.  Recheck in 1 year with ABIs.  Continue current medical regimen.

## 2017-09-16 ENCOUNTER — Other Ambulatory Visit: Payer: Self-pay | Admitting: Family Medicine

## 2017-09-16 ENCOUNTER — Ambulatory Visit
Admission: RE | Admit: 2017-09-16 | Discharge: 2017-09-16 | Disposition: A | Discharge status: Home / Self Care | Payer: Medicare Other | Source: Ambulatory Visit | Attending: Family Medicine | Admitting: Family Medicine

## 2017-09-16 DIAGNOSIS — I700 Atherosclerosis of aorta: Secondary | ICD-10-CM | POA: Diagnosis not present

## 2017-09-16 DIAGNOSIS — R310 Gross hematuria: Secondary | ICD-10-CM

## 2017-09-16 DIAGNOSIS — N200 Calculus of kidney: Secondary | ICD-10-CM | POA: Diagnosis not present

## 2017-09-16 DIAGNOSIS — N281 Cyst of kidney, acquired: Secondary | ICD-10-CM | POA: Insufficient documentation

## 2017-09-16 DIAGNOSIS — K769 Liver disease, unspecified: Secondary | ICD-10-CM | POA: Diagnosis not present

## 2017-09-16 DIAGNOSIS — I2510 Atherosclerotic heart disease of native coronary artery without angina pectoris: Secondary | ICD-10-CM | POA: Diagnosis not present

## 2017-10-07 ENCOUNTER — Other Ambulatory Visit: Payer: Self-pay | Admitting: Urology

## 2017-10-07 DIAGNOSIS — R310 Gross hematuria: Secondary | ICD-10-CM

## 2017-10-14 ENCOUNTER — Ambulatory Visit
Admission: RE | Admit: 2017-10-14 | Discharge: 2017-10-14 | Disposition: A | Discharge status: Home / Self Care | Payer: Medicare Other | Source: Ambulatory Visit | Attending: Urology | Admitting: Urology

## 2017-10-14 DIAGNOSIS — N2889 Other specified disorders of kidney and ureter: Secondary | ICD-10-CM | POA: Insufficient documentation

## 2017-10-14 DIAGNOSIS — I700 Atherosclerosis of aorta: Secondary | ICD-10-CM | POA: Insufficient documentation

## 2017-10-14 DIAGNOSIS — M5136 Other intervertebral disc degeneration, lumbar region: Secondary | ICD-10-CM | POA: Insufficient documentation

## 2017-10-14 DIAGNOSIS — I517 Cardiomegaly: Secondary | ICD-10-CM | POA: Diagnosis not present

## 2017-10-14 DIAGNOSIS — R310 Gross hematuria: Secondary | ICD-10-CM | POA: Diagnosis not present

## 2017-10-14 DIAGNOSIS — K5900 Constipation, unspecified: Secondary | ICD-10-CM | POA: Insufficient documentation

## 2017-10-14 DIAGNOSIS — K8020 Calculus of gallbladder without cholecystitis without obstruction: Secondary | ICD-10-CM | POA: Insufficient documentation

## 2017-10-14 DIAGNOSIS — I2510 Atherosclerotic heart disease of native coronary artery without angina pectoris: Secondary | ICD-10-CM | POA: Diagnosis not present

## 2017-10-14 MED ORDER — IOPAMIDOL (ISOVUE-300) INJECTION 61%
125.0000 mL | Freq: Once | INTRAVENOUS | Status: AC | PRN
  Administered 2017-10-14: 125 mL via INTRAVENOUS

## 2017-10-30 ENCOUNTER — Emergency Department
Admission: EM | Admit: 2017-10-30 | Discharge: 2017-10-30 | Disposition: A | Discharge status: Home / Self Care | Payer: Medicare Other | Attending: Emergency Medicine | Admitting: Emergency Medicine

## 2017-10-30 ENCOUNTER — Emergency Department: Payer: Medicare Other

## 2017-10-30 ENCOUNTER — Other Ambulatory Visit: Payer: Self-pay

## 2017-10-30 ENCOUNTER — Encounter: Payer: Self-pay | Admitting: *Deleted

## 2017-10-30 DIAGNOSIS — Z7982 Long term (current) use of aspirin: Secondary | ICD-10-CM | POA: Insufficient documentation

## 2017-10-30 DIAGNOSIS — Z7901 Long term (current) use of anticoagulants: Secondary | ICD-10-CM | POA: Insufficient documentation

## 2017-10-30 DIAGNOSIS — Z79899 Other long term (current) drug therapy: Secondary | ICD-10-CM | POA: Insufficient documentation

## 2017-10-30 DIAGNOSIS — G20 Parkinson's disease: Secondary | ICD-10-CM | POA: Diagnosis not present

## 2017-10-30 DIAGNOSIS — H539 Unspecified visual disturbance: Secondary | ICD-10-CM

## 2017-10-30 DIAGNOSIS — H538 Other visual disturbances: Secondary | ICD-10-CM | POA: Insufficient documentation

## 2017-10-30 LAB — CBC
HCT: 40.3 % (ref 40.0–52.0)
Hemoglobin: 13.4 g/dL (ref 13.0–18.0)
MCH: 31.5 pg (ref 26.0–34.0)
MCHC: 33.3 g/dL (ref 32.0–36.0)
MCV: 94.7 fL (ref 80.0–100.0)
PLATELETS: 236 10*3/uL (ref 150–440)
RBC: 4.25 MIL/uL — AB (ref 4.40–5.90)
RDW: 13.8 % (ref 11.5–14.5)
WBC: 5.1 10*3/uL (ref 3.8–10.6)

## 2017-10-30 LAB — COMPREHENSIVE METABOLIC PANEL
ALK PHOS: 50 U/L (ref 38–126)
AST: 15 U/L (ref 15–41)
Albumin: 3.8 g/dL (ref 3.5–5.0)
Anion gap: 6 (ref 5–15)
BILIRUBIN TOTAL: 0.5 mg/dL (ref 0.3–1.2)
BUN: 18 mg/dL (ref 8–23)
CALCIUM: 9.2 mg/dL (ref 8.9–10.3)
CO2: 30 mmol/L (ref 22–32)
Chloride: 103 mmol/L (ref 98–111)
Creatinine, Ser: 0.84 mg/dL (ref 0.61–1.24)
Glucose, Bld: 101 mg/dL — ABNORMAL HIGH (ref 70–99)
Potassium: 4.4 mmol/L (ref 3.5–5.1)
Sodium: 139 mmol/L (ref 135–145)
Total Protein: 6.9 g/dL (ref 6.5–8.1)

## 2017-10-30 LAB — DIFFERENTIAL
BASOS ABS: 0.1 10*3/uL (ref 0–0.1)
Basophils Relative: 1 %
Eosinophils Absolute: 0.3 10*3/uL (ref 0–0.7)
Eosinophils Relative: 5 %
LYMPHS ABS: 0.9 10*3/uL — AB (ref 1.0–3.6)
LYMPHS PCT: 17 %
MONO ABS: 0.6 10*3/uL (ref 0.2–1.0)
MONOS PCT: 12 %
NEUTROS ABS: 3.3 10*3/uL (ref 1.4–6.5)
Neutrophils Relative %: 65 %

## 2017-10-30 LAB — TROPONIN I

## 2017-10-30 LAB — PROTIME-INR
INR: 0.94
PROTHROMBIN TIME: 12.5 s (ref 11.4–15.2)

## 2017-10-30 LAB — APTT: APTT: 30 s (ref 24–36)

## 2017-10-30 LAB — GLUCOSE, CAPILLARY: GLUCOSE-CAPILLARY: 94 mg/dL (ref 70–99)

## 2017-10-30 NOTE — ED Notes (Signed)
Pt's sxs have resolved, denies associated sxs of possible stroke. Pt denies pain, shortness of breathe, n/v/d and fever.

## 2017-10-30 NOTE — Discharge Instructions (Addendum)
It is possible that your blurred vision is related to a TIA, however there is no evidence of stroke at this time.  Continue to take your regular medications as prescribed.  Make an appointment to follow-up with your primary care doctor soon as possible.  Return to the ER immediately for new, worsening, or persistent blurred or decreased vision, severe headache, vomiting, weakness or numbness, speech disturbance, or any other new or worsening symptoms that concern you.

## 2017-10-30 NOTE — ED Triage Notes (Signed)
Pt to ED reporting blurred vision that started yesterday and subsided but returned at noon today. Visual changes are bilateral. No headache, weakness or other neuro deficits noted. No head trauma.

## 2017-10-30 NOTE — ED Provider Notes (Signed)
[facility] Emergency Department Provider Note ____________________________________________   First MD Initiated Contact with Patient 10/30/17 1750     (approximate)  I have reviewed the triage vital signs and the nursing notes.   HISTORY  Chief Complaint Blurred Vision    HPI Derrick Miller is a 77 y.o. male with PMH as noted below as well as a history of a prior stroke who presents with blurred vision, acute onset yesterday and subsiding, and then returning at noon today.  The patient states that the symptoms lasted about 45 minutes today and have now resolved again.  He denies any headache, lightheadedness, confusion, vomiting, or fever.  During his prior stroke he did have some visual disturbance (primarily to his peripheral vision) although with a severe headache at that time.  Past Medical History:  Diagnosis Date  . Parkinson disease [facility](HCC)     Patient Active Problem List   Diagnosis Date Noted  . Parkinson disease (HCC) 12/03/2016  . Pain in limb 12/03/2016  . PVD (peripheral vascular disease) (HCC) 12/03/2016    Past Surgical History:  Procedure Laterality Date  . CARDIAC SURGERY      Prior to Admission medications   Medication Sig Start Date End Date Taking? Authorizing Provider  aspirin EC 81 MG tablet Take by mouth.    [provider]  atenolol (TENORMIN) 25 MG tablet  10/04/16   [provider]  carbidopa-levodopa (SINEMET CR) 50-200 MG tablet  10/29/16   [provider]  Misc Natural Products (TURMERIC CURCUMIN) CAPS Take by mouth daily.    [provider]  Omega-3 Fatty Acids (FISH OIL) 1000 MG CPDR Take by mouth.    [provider]  omeprazole (PRILOSEC) 20 MG capsule  10/19/16   [provider]  tamsulosin (FLOMAX) 0.4 MG CAPS capsule Take by mouth. 04/15/17 04/15/18  [provider]  thiamine (VITAMIN B-1) 100 MG tablet Take 100 mg by mouth daily.    [provider]  vitamin C (ASCORBIC ACID) 500 MG tablet Take 500 mg by mouth daily.    [provider]  warfarin (COUMADIN) 1 MG tablet TAKE 1 and 1/2 TABLETS BY MOUTH ONCE DAILY 09/06/16   [provider]  warfarin (COUMADIN) 7.5 MG tablet TAKE 1 TABLET BY MOUTH ONCE DAILY. 09/06/16   [provider]    Allergies Lodine [etodolac] and Naproxen  Family History  Problem Relation Age of Onset  . Heart disease Mother   . Heart disease Father     Social History Social History   Tobacco Use  . Smoking status: Never Smoker  . Smokeless tobacco: Never Used  Substance Use Topics  . Alcohol use: Yes  . Drug use: No    Review of Systems  Constitutional: No fever. Eyes: Positive for resolved blurred vision. ENT: No sore throat. Cardiovascular: Denies chest pain. Respiratory: Denies shortness of breath. Gastrointestinal: No nausea or vomiting.  Genitourinary: Negative for flank pain.  Musculoskeletal: Negative for back pain. Skin: Negative for rash. Neurological: Negative for headaches, focal weakness or numbness.   ____________________________________________   PHYSICAL EXAM:  VITAL SIGNS: ED Triage Vitals  Enc Vitals Group     BP 10/30/17 1641 (!) 133/58     Pulse Rate 10/30/17 1641 72     Resp 10/30/17 1641 16     Temp 10/30/17 1641 98.1 F (36.7 C)     Temp Source 10/30/17 1641 Oral     SpO2 10/30/17 1641 98 %  Weight 10/30/17 1642 161 lb (73 kg)     Height 10/30/17 1642 5\' 7"  (1.702 m)     Head Circumference --      Peak Flow --      Pain Score 10/30/17 1642 0     Pain Loc --      Pain Edu? --      Excl. in GC? --     Constitutional: Alert and oriented. Well appearing and in no acute distress. Eyes: Conjunctivae are normal.  EOMI.  PERRLA.   Head: Atraumatic.  Facial asymmetry with no acute droop. Nose: No congestion/rhinnorhea. Mouth/Throat: Mucous membranes are moist.   Neck: Normal range of motion.  Cardiovascular: Normal rate,  regular rhythm. Grossly normal heart sounds.  Good peripheral circulation. Respiratory: Normal respiratory effort.  No retractions. Lungs CTAB. Gastrointestinal: Soft and nontender. No distention.  Genitourinary: No flank tenderness. Musculoskeletal: No lower extremity edema.  Extremities warm and well perfused.  Neurologic:  Normal speech and language.  Cranial nerves III through XII intact menses patient has facial asymmetry but good tone to all facial muscles, and the patient and his wife state they do not believe the asymmetry is new).  Motor and sensory intact in all extremities.  Normal coronation.  No ataxia.  No motor drift.  No gross focal neurologic deficits are appreciated.  Skin:  Skin is warm and dry. No rash noted. Psychiatric: Mood and affect are normal. Speech and behavior are normal.  ____________________________________________   LABS (all labs ordered are listed, but only abnormal results are displayed)  Labs Reviewed  CBC - Abnormal; Notable for the following components:      Result Value   RBC 4.25 (*)    All other components within normal limits  DIFFERENTIAL - Abnormal; Notable for the following components:   Lymphs Abs 0.9 (*)    All other components within normal limits  COMPREHENSIVE METABOLIC PANEL - Abnormal; Notable for the following components:   Glucose, Bld 101 (*)    All other components within normal limits  PROTIME-INR  APTT  TROPONIN I  GLUCOSE, CAPILLARY  CBG MONITORING, ED   ____________________________________________  EKG  ED ECG REPORT I, Dionne Bucy, the attending physician, personally viewed and interpreted this ECG.  Date: 10/30/2017 EKG Time: 1656 Rate: 75 Rhythm: normal sinus rhythm with frequent PVCs QRS Axis: normal Intervals: normal ST/T Wave abnormalities: normal Narrative Interpretation: no evidence of acute ischemia  ____________________________________________  RADIOLOGY  CT head: No ICH.  No evidence of  acute stroke. MRI brain: No evidence of acute stroke  ____________________________________________   PROCEDURES  Procedure(s) performed: No  Procedures  Critical Care performed: No ____________________________________________   INITIAL IMPRESSION / ASSESSMENT AND PLAN / ED COURSE  Pertinent labs & imaging results that were available during my care of the patient were reviewed by me and considered in my medical decision making (see chart for details).  77 year old male with PMH as noted above as well as a history of prior stroke with peripheral vision deficit presents with generalized blurred vision to both eyes with one mild episode yesterday and then a more distinct episode this afternoon which resolved after about 45 minutes.  He denies any other neurologic symptoms, and states he is asymptomatic currently.  On exam, the patient is well-appearing, vital signs are normal, and the neuro exam is nonfocal.  Initial CT head obtained from triage shows no acute findings.  Overall in this patient I am most concerned for possible TIA, although near syncope  or other benign etiology is possible.  Given the resolved symptoms and a negative CT there is no evidence of acute stroke.  We will obtain an MRI.  I anticipate that if it shows no acute lesions, and the patient has no further symptoms he will be stable for discharge home.  If the MRI has acute abnormalities he may require admission.  ----------------------------------------- 8:36 PM on 10/30/2017 -----------------------------------------  The patient is remained asymptomatic in the ED with no recurrence of the vision disturbance.  MRI is negative for any acute findings.  Patient's ABCD 2 score is 2.  The patient and his wife both would like to go home.  At this time, given that the symptoms have been absent for over 8 hours and with the negative imaging, he is stable for discharge.  I instructed him to follow-up with his regular doctor and  continue his normal medications.  Return precautions given, and the patient and his wife expressed understanding.   ____________________________________________   FINAL CLINICAL IMPRESSION(S) / ED DIAGNOSES  Final diagnoses:  Vision disturbance      NEW MEDICATIONS STARTED DURING THIS VISIT:  New Prescriptions   No medications on file     Note:  This document was prepared using Dragon voice recognition software and may include unintentional dictation errors.    Dionne Bucy, MD 10/30/17 2037

## 2017-11-04 ENCOUNTER — Encounter
Admission: RE | Admit: 2017-11-04 | Discharge: 2017-11-04 | Disposition: A | Discharge status: Home / Self Care | Payer: Medicare Other | Source: Ambulatory Visit | Attending: Urology | Admitting: Urology

## 2017-11-04 DIAGNOSIS — I252 Old myocardial infarction: Secondary | ICD-10-CM | POA: Diagnosis not present

## 2017-11-04 DIAGNOSIS — G20 Parkinson's disease: Secondary | ICD-10-CM | POA: Diagnosis not present

## 2017-11-04 DIAGNOSIS — N200 Calculus of kidney: Secondary | ICD-10-CM | POA: Diagnosis present

## 2017-11-04 DIAGNOSIS — Z8673 Personal history of transient ischemic attack (TIA), and cerebral infarction without residual deficits: Secondary | ICD-10-CM | POA: Diagnosis not present

## 2017-11-04 LAB — PROTIME-INR
INR: 0.96
PROTHROMBIN TIME: 12.7 s (ref 11.4–15.2)

## 2017-11-05 MED ORDER — LEVOFLOXACIN 500 MG PO TABS
500.0000 mg | ORAL_TABLET | Freq: Every day | ORAL | Status: DC
  Administered 2017-11-06: 500 mg via ORAL

## 2017-11-06 ENCOUNTER — Ambulatory Visit
Admission: RE | Admit: 2017-11-06 | Discharge: 2017-11-06 | Disposition: A | Discharge status: Home / Self Care | Payer: Medicare Other | Source: Ambulatory Visit | Attending: Urology | Admitting: Urology

## 2017-11-06 ENCOUNTER — Other Ambulatory Visit: Payer: Self-pay

## 2017-11-06 ENCOUNTER — Encounter
Admission: RE | Disposition: A | Discharge status: Home / Self Care | Payer: Self-pay | Source: Ambulatory Visit | Attending: Urology

## 2017-11-06 ENCOUNTER — Encounter: Payer: Self-pay | Admitting: *Deleted

## 2017-11-06 DIAGNOSIS — G20 Parkinson's disease: Secondary | ICD-10-CM | POA: Insufficient documentation

## 2017-11-06 DIAGNOSIS — N200 Calculus of kidney: Secondary | ICD-10-CM | POA: Diagnosis not present

## 2017-11-06 DIAGNOSIS — Z8673 Personal history of transient ischemic attack (TIA), and cerebral infarction without residual deficits: Secondary | ICD-10-CM | POA: Insufficient documentation

## 2017-11-06 DIAGNOSIS — I252 Old myocardial infarction: Secondary | ICD-10-CM | POA: Insufficient documentation

## 2017-11-06 HISTORY — DX: Pure hypercholesterolemia, unspecified: E78.00

## 2017-11-06 HISTORY — DX: Acute myocardial infarction, unspecified: I21.9

## 2017-11-06 HISTORY — PX: EXTRACORPOREAL SHOCK WAVE LITHOTRIPSY: SHX1557

## 2017-11-06 HISTORY — DX: Unspecified atrial fibrillation: I48.91

## 2017-11-06 HISTORY — DX: Personal history of urinary calculi: Z87.442

## 2017-11-06 HISTORY — DX: Atherosclerotic heart disease of native coronary artery without angina pectoris: I25.10

## 2017-11-06 HISTORY — DX: Heart failure, unspecified: I50.9

## 2017-11-06 HISTORY — DX: Cerebral infarction, unspecified: I63.9

## 2017-11-06 HISTORY — DX: Other localized visual field defect, left eye: H53.452

## 2017-11-06 SURGERY — LITHOTRIPSY, ESWL
Anesthesia: Moderate Sedation | Laterality: Right

## 2017-11-06 MED ORDER — PROMETHAZINE HCL 25 MG/ML IJ SOLN
INTRAMUSCULAR | Status: AC
  Filled 2017-11-06: qty 1

## 2017-11-06 MED ORDER — ONDANSETRON 8 MG PO TBDP: 4.0000 mg | ORAL_TABLET | Freq: Four times a day (QID) | ORAL | 3 refills | Status: DC | PRN

## 2017-11-06 MED ORDER — MORPHINE SULFATE (PF) 10 MG/ML IV SOLN
INTRAVENOUS | Status: AC
  Filled 2017-11-06: qty 1

## 2017-11-06 MED ORDER — MIDAZOLAM HCL 2 MG/2ML IJ SOLN
1.0000 mg | Freq: Once | INTRAMUSCULAR | Status: AC
  Administered 2017-11-06: 1 mg via INTRAMUSCULAR

## 2017-11-06 MED ORDER — FUROSEMIDE 10 MG/ML IJ SOLN
10.0000 mg | Freq: Once | INTRAMUSCULAR | Status: AC
  Administered 2017-11-06: 10 mg via INTRAVENOUS

## 2017-11-06 MED ORDER — PROMETHAZINE HCL 25 MG/ML IJ SOLN
25.0000 mg | Freq: Four times a day (QID) | INTRAMUSCULAR | Status: DC | PRN
  Administered 2017-11-06: 25 mg via INTRAMUSCULAR

## 2017-11-06 MED ORDER — LEVOFLOXACIN 500 MG PO TABS
ORAL_TABLET | ORAL | Status: AC
  Filled 2017-11-06: qty 1

## 2017-11-06 MED ORDER — ACETAMINOPHEN-CODEINE #3 300-30 MG PO TABS: 1.0000 | ORAL_TABLET | ORAL | 1 refills | Status: DC | PRN

## 2017-11-06 MED ORDER — MIDAZOLAM HCL 2 MG/2ML IJ SOLN
INTRAMUSCULAR | Status: AC
  Filled 2017-11-06: qty 2

## 2017-11-06 MED ORDER — DIPHENHYDRAMINE HCL 25 MG PO CAPS
25.0000 mg | ORAL_CAPSULE | Freq: Four times a day (QID) | ORAL | Status: DC | PRN
  Administered 2017-11-06: 25 mg via ORAL

## 2017-11-06 MED ORDER — DOCUSATE SODIUM 100 MG PO CAPS: 200.0000 mg | ORAL_CAPSULE | Freq: Two times a day (BID) | ORAL | 3 refills | Status: DC

## 2017-11-06 MED ORDER — DEXTROSE-NACL 5-0.45 % IV SOLN
INTRAVENOUS | Status: DC
  Administered 2017-11-06: 12:00:00 via INTRAVENOUS

## 2017-11-06 MED ORDER — MORPHINE SULFATE (PF) 10 MG/ML IV SOLN
10.0000 mg | Freq: Once | INTRAVENOUS | Status: AC
  Administered 2017-11-06: 10 mg via INTRAMUSCULAR

## 2017-11-06 MED ORDER — FUROSEMIDE 10 MG/ML IJ SOLN
INTRAMUSCULAR | Status: AC
  Administered 2017-11-06: 10 mg via INTRAVENOUS
  Filled 2017-11-06: qty 2

## 2017-11-06 MED ORDER — DIPHENHYDRAMINE HCL 25 MG PO CAPS
ORAL_CAPSULE | ORAL | Status: AC
  Filled 2017-11-06: qty 1

## 2017-11-06 NOTE — Discharge Instructions (Signed)
Lithotripsy, Care After °This sheet gives you information about how to care for yourself after your procedure. Your health care provider may also give you more specific instructions. If you have problems or questions, contact your health care provider. °What can I expect after the procedure? °After the procedure, it is common to have: °· Some blood in your urine. This should only last for a few days. °· Soreness in your back, sides, or upper abdomen for a few days. °· Blotches or bruises on your back where the pressure wave entered the skin. °· Pain, discomfort, or nausea when pieces (fragments) of the kidney stone move through the tube that carries urine from the kidney to the bladder (ureter). Stone fragments may pass soon after the procedure, but they may continue to pass for up to 4-8 weeks. °? If you have severe pain or nausea, contact your health care provider. This may be caused by a large stone that was not broken up, and this may mean that you need more treatment. °· Some pain or discomfort during urination. °· Some pain or discomfort in the lower abdomen or (in men) at the base of the penis. ° °Follow these instructions at home: °Medicines °· Take over-the-counter and prescription medicines only as told by your health care provider. °· If you were prescribed an antibiotic medicine, take it as told by your health care provider. Do not stop taking the antibiotic even if you start to feel better. °· Do not drive for 24 hours if you were given a medicine to help you relax (sedative). °· Do not drive or use heavy machinery while taking prescription pain medicine. °Eating and drinking °· Drink enough water and fluids to keep your urine clear or pale yellow. This helps any remaining pieces of the stone to pass. It can also help prevent new stones from forming. °· Eat plenty of fresh fruits and vegetables. °· Follow instructions from your health care provider about eating and drinking restrictions. You may be  instructed: °? To reduce how much salt (sodium) you eat or drink. Check ingredients and nutrition facts on packaged foods and beverages. °? To reduce how much meat you eat. °· Eat the recommended amount of calcium for your age and gender. Ask your health care provider how much calcium you should have. °General instructions °· Get plenty of rest. °· Most people can resume normal activities 1-2 days after the procedure. Ask your health care provider what activities are safe for you. °· If directed, strain all urine through the strainer that was provided by your health care provider. °? Keep all fragments for your health care provider to see. Any stones that are found may be sent to a medical lab for examination. The stone may be as small as a grain of salt. °· Keep all follow-up visits as told by your health care provider. This is important. °Contact a health care provider if: °· You have pain that is severe or does not get better with medicine. °· You have nausea that is severe or does not go away. °· You have blood in your urine longer than your health care provider told you to expect. °· You have more blood in your urine. °· You have pain during urination that does not go away. °· You urinate more frequently than usual and this does not go away. °· You develop a rash or any other possible signs of an allergic reaction. °Get help right away if: °· You have severe pain in   your back, sides, or upper abdomen.  You have severe pain while urinating.  Your urine is very dark red.  You have blood in your stool (feces).  You cannot pass any urine at all.  You feel a strong urge to urinate after emptying your bladder.  You have a fever or chills.  You develop shortness of breath, difficulty breathing, or chest pain.  You have severe nausea that leads to persistent vomiting.  You faint. Summary  After this procedure, it is common to have some pain, discomfort, or nausea when pieces (fragments) of the  kidney stone move through the tube that carries urine from the kidney to the bladder (ureter). If this pain or nausea is severe, however, you should contact your health care provider.  Most people can resume normal activities 1-2 days after the procedure. Ask your health care provider what activities are safe for you.  Drink enough water and fluids to keep your urine clear or pale yellow. This helps any remaining pieces of the stone to pass, and it can help prevent new stones from forming.  If directed, strain your urine and keep all fragments for your health care provider to see. Fragments or stones may be as small as a grain of salt.  Get help right away if you have severe pain in your back, sides, or upper abdomen or have severe pain while urinating. This information is not intended to replace advice given to you by your health care provider. Make sure you discuss any questions you have with your health care provider. Document Released: 02/24/2007 Document Revised: 12/27/2015 Document Reviewed: 12/27/2015 Elsevier Interactive Patient Education  2018 [employer].   Kidney Stones Kidney stones (urolithiasis) are rock-like masses that form inside of the kidneys. Kidneys are organs that make pee (urine). A kidney stone can cause very bad pain and can block the flow of pee. The stone usually leaves your body (passes) through your pee. You may need to have a doctor take out the stone. Follow these instructions at home: Eating and drinking  Drink enough fluid to keep your pee clear or pale yellow. This will help you pass the stone.  If told by your doctor, change the foods you eat (your diet). This may include: ? Limiting how much salt (sodium) you eat. ? Eating more fruits and vegetables. ? Limiting how much meat, poultry, fish, and eggs you eat.  Follow instructions from your doctor about eating or drinking restrictions. General instructions  Collect pee samples as told by your doctor.  You may need to collect a pee sample: ? 24 hours after a stone comes out. ? 8-12 weeks after a stone comes out, and every 6-12 months after that.  Strain your pee every time you pee (urinate), for as long as told. Use the strainer that your doctor recommends.  Do not throw out the stone. Keep it so that it can be tested by your doctor.  Take over-the-counter and prescription medicines only as told by your doctor.  Keep all follow-up visits as told by your doctor. This is important. You may need follow-up tests. Preventing kidney stones To prevent another kidney stone:  Drink enough fluid to keep your pee clear or pale yellow. This is the best way to prevent kidney stones.  Eat healthy foods.  Avoid certain foods as told by your doctor. You may be told to eat less protein.  Stay at a healthy weight.  Contact a doctor if:  You have pain that  gets worse or does not get better with medicine. Get help right away if:  You have a fever or chills.  You get very bad pain.  You get new pain in your belly (abdomen).  You pass out (faint).  You cannot pee. This information is not intended to replace advice given to you by your health care provider. Make sure you discuss any questions you have with your health care provider. Document Released: 07/24/2007 Document Revised: 10/24/2015 Document Reviewed: 10/24/2015 Elsevier Interactive Patient Education  2017 [employer]   AMBULATORY SURGERY  DISCHARGE INSTRUCTIONS   1) The drugs that you were given will stay in your system until tomorrow so for the next 24 hours you should not:  A) Drive an automobile B) Make any legal decisions C) Drink any alcoholic beverage   2) You may resume regular meals tomorrow.  Today it is better to start with liquids and gradually work up to solid foods.  You may eat anything you prefer, but it is better to start with liquids, then soup and crackers, and gradually work up to solid  foods.   3) Please notify your doctor immediately if you have any unusual bleeding, trouble breathing, redness and pain at the surgery site, drainage, fever, or pain not relieved by medication.    4) Additional Instructions:        Please contact your physician with any problems or Same Day Surgery at [phone], Monday through Friday 6 am to 4 pm, or [place] at [facility] Main number at [phone].

## 2017-11-08 ENCOUNTER — Encounter: Payer: Self-pay | Admitting: Urology

## 2018-04-09 ENCOUNTER — Other Ambulatory Visit: Payer: Self-pay | Admitting: Urology

## 2018-04-09 ENCOUNTER — Other Ambulatory Visit (HOSPITAL_COMMUNITY): Payer: Self-pay | Admitting: Urology

## 2018-04-09 DIAGNOSIS — N281 Cyst of kidney, acquired: Secondary | ICD-10-CM

## 2018-05-20 ENCOUNTER — Ambulatory Visit: Admission: RE | Admit: 2018-05-20 | Payer: Medicare Other | Source: Ambulatory Visit

## 2018-07-14 ENCOUNTER — Encounter (INDEPENDENT_AMBULATORY_CARE_PROVIDER_SITE_OTHER): Payer: Medicare Other

## 2018-07-14 ENCOUNTER — Ambulatory Visit (INDEPENDENT_AMBULATORY_CARE_PROVIDER_SITE_OTHER): Payer: Medicare Other | Admitting: Vascular Surgery

## 2018-12-10 ENCOUNTER — Other Ambulatory Visit: Payer: Self-pay | Admitting: Internal Medicine

## 2018-12-10 DIAGNOSIS — M542 Cervicalgia: Secondary | ICD-10-CM

## 2018-12-10 DIAGNOSIS — E7800 Pure hypercholesterolemia, unspecified: Secondary | ICD-10-CM

## 2018-12-16 ENCOUNTER — Ambulatory Visit: Payer: Medicare Other | Attending: Internal Medicine

## 2019-01-05 ENCOUNTER — Ambulatory Visit
Admission: RE | Admit: 2019-01-05 | Discharge: 2019-01-05 | Disposition: A | Discharge status: Home / Self Care | Payer: Medicare Other | Source: Ambulatory Visit | Attending: Urology | Admitting: Urology

## 2019-01-05 ENCOUNTER — Other Ambulatory Visit: Payer: Self-pay

## 2019-01-05 DIAGNOSIS — N281 Cyst of kidney, acquired: Secondary | ICD-10-CM | POA: Insufficient documentation

## 2019-01-05 MED ORDER — GADOBUTROL 1 MMOL/ML IV SOLN
7.0000 mL | Freq: Once | INTRAVENOUS | Status: AC | PRN
  Administered 2019-01-05: 7 mL via INTRAVENOUS

## 2019-11-12 ENCOUNTER — Other Ambulatory Visit: Payer: Self-pay | Admitting: Family Medicine

## 2019-11-12 ENCOUNTER — Ambulatory Visit
Admission: RE | Admit: 2019-11-12 | Discharge: 2019-11-12 | Disposition: A | Discharge status: Home / Self Care | Payer: Medicare Other | Source: Ambulatory Visit | Attending: Family Medicine | Admitting: Family Medicine

## 2019-11-12 ENCOUNTER — Other Ambulatory Visit: Payer: Self-pay

## 2019-11-12 DIAGNOSIS — R8290 Unspecified abnormal findings in urine: Secondary | ICD-10-CM

## 2019-11-12 DIAGNOSIS — R109 Unspecified abdominal pain: Secondary | ICD-10-CM | POA: Diagnosis present

## 2020-05-22 IMAGING — MR MR ABDOMEN WO/W CM
20 series · 48 of 48 positions shown · IV contrast (7ml Gadavist)
Comparison: CT on 10/14/2017

CLINICAL DATA: Follow-up complex cystic lesion of left kidney.

EXAM:
MRI ABDOMEN WITHOUT AND WITH CONTRAST
TECHNIQUE: Multiplanar multisequence MR imaging of the abdomen was performed
both before and after the administration of intravenous contrast.
CONTRAST:  7mL GADAVIST GADOBUTROL 1 MMOL/ML IV SOLN

[Series 2: cor haste · coronal · 6.0mm · 1.19mm/px · 2 of 32 slices shown]
[im 1/32]
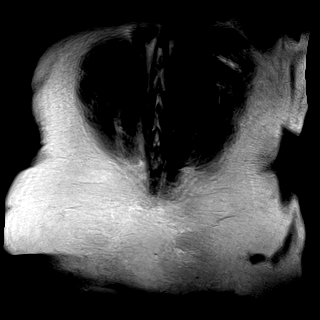
[im 32/32]
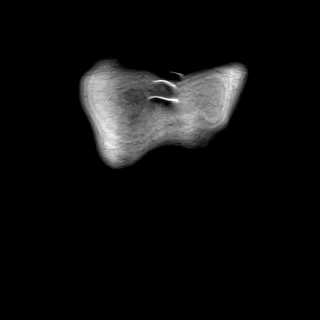

[Series 4: T2 fat-sat · axial · 6.5mm · 1.19mm/px · z∈[-66,+191]mm · 2 of 34 slices shown]
[im 1/34]
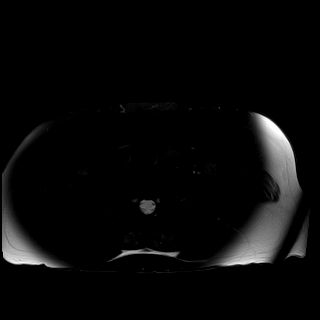
[im 34/34]
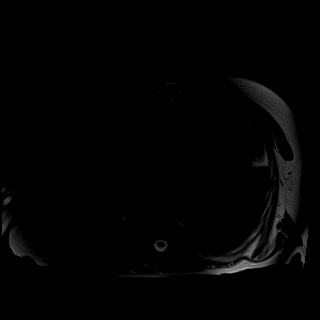

[Series 6: DWI · axial · 6.5mm · 1.42mm/px · z∈[-66,+191]mm · 2 of 34 slices shown (1 of 4)]
[im 1/34]
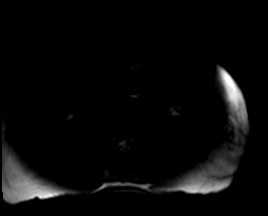
[im 34/34]
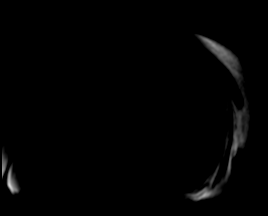

[Series 6: DWI · axial · 6.5mm · 1.42mm/px · z∈[-66,+191]mm · 2 of 34 slices shown (2 of 4)]
[im 1/34]
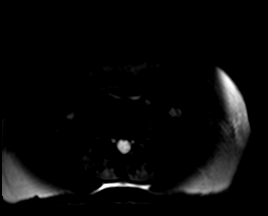
[im 34/34]
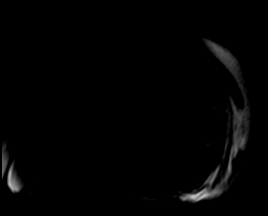

[Series 6: DWI · axial · 6.5mm · 1.42mm/px · 1 of 34 slices shown (3 of 4)]
[im 1/34]
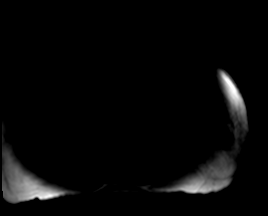

[Series 7: DWI · axial · 6.5mm · 1.42mm/px · 1 of 34 slices shown (4 of 4)]
[im 1/34]
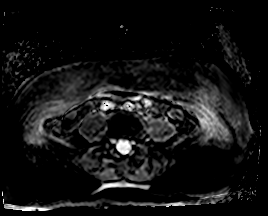

[Series 8: ax in & · axial · 3.0mm · 1.19mm/px · z∈[-56,+181]mm · 3 of 80 slices shown (1 of 2)]
[im 1/80]
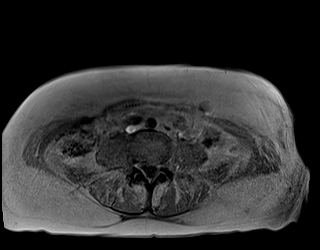
[im 40/80]
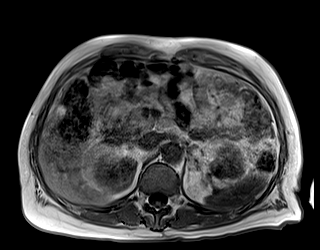
[im 80/80]
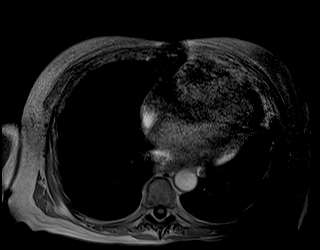

[Series 8: ax in & · axial · 3.0mm · 1.19mm/px · z∈[-56,+181]mm · 3 of 80 slices shown (2 of 2)]
[im 1/80]
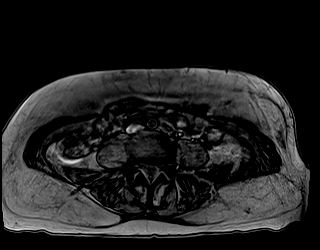
[im 40/80]
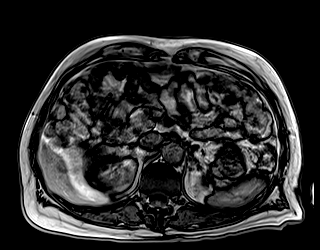
[im 80/80]
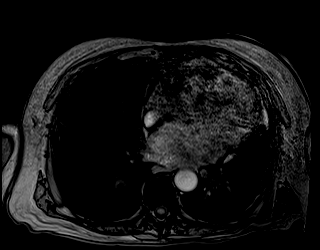

[Series 9: bSSFP · axial · 6.5mm · 0.74mm/px · 1 of 36 slices shown]
[im 1/36]
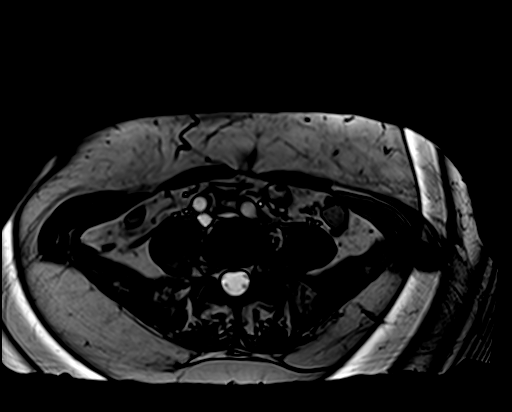

[Series 10: T1 dynamic · axial · non-contrast · 3.0mm · 1.19mm/px · z∈[-65,+196]mm · 3 of 88 slices shown (1 of 9)]
[im 1/88]
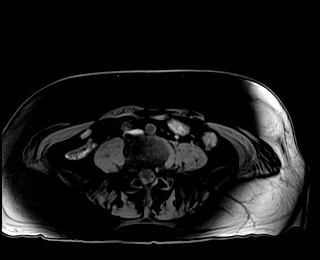
[im 44/88]
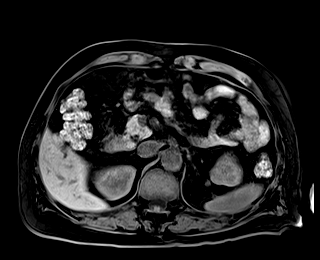
[im 88/88]
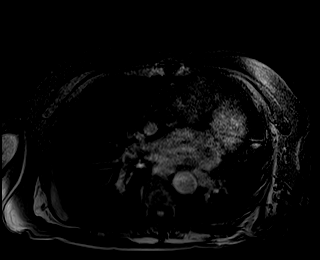

[Series 11: T1 dynamic · axial · 3.0mm · 1.19mm/px · z∈[-65,+196]mm · 3 of 88 slices shown (2 of 9)]
[im 1/88]
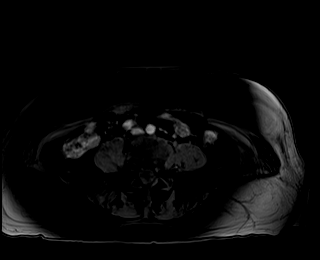
[im 44/88]
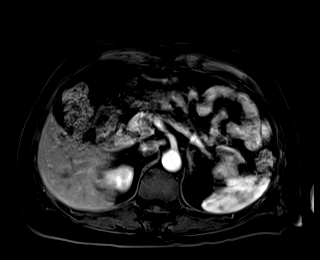
[im 88/88]
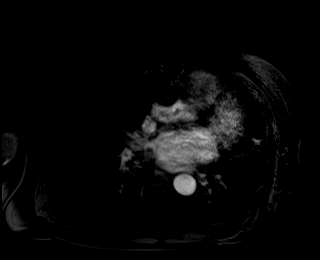

[Series 12: T1 dynamic · axial · 3.0mm · 1.19mm/px · z∈[-65,+196]mm · 3 of 88 slices shown (3 of 9)]
[im 1/88]
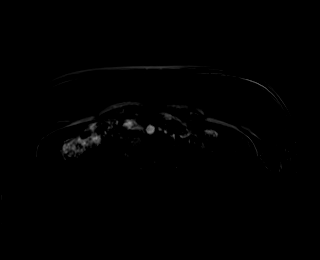
[im 44/88]
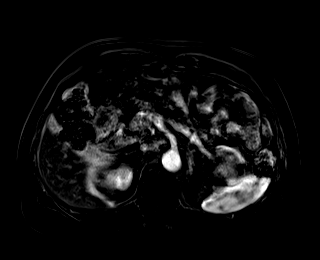
[im 88/88]
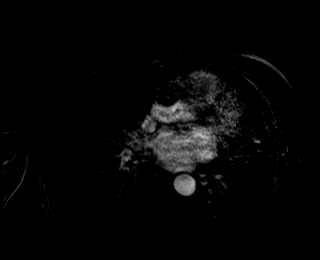

[Series 13: T1 dynamic · axial · 3.0mm · 1.19mm/px · z∈[-65,+196]mm · 3 of 88 slices shown (4 of 9)]
[im 1/88]
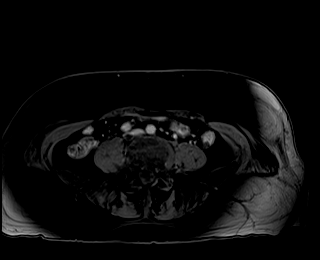
[im 44/88]
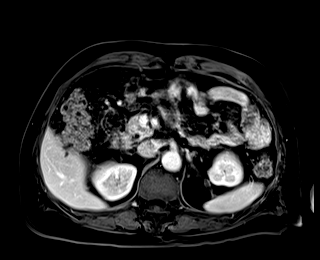
[im 88/88]
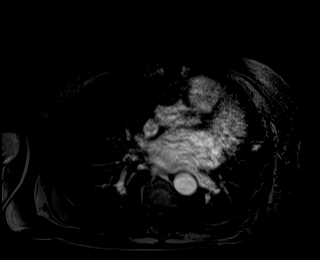

[Series 14: T1 dynamic · axial · 3.0mm · 1.19mm/px · z∈[-65,+196]mm · 3 of 88 slices shown (5 of 9)]
[im 1/88]
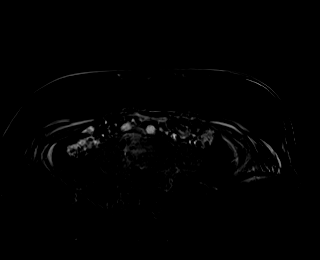
[im 44/88]
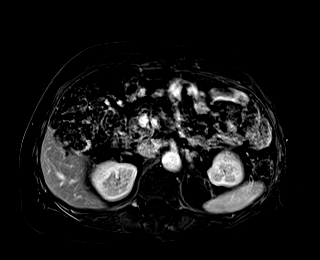
[im 88/88]
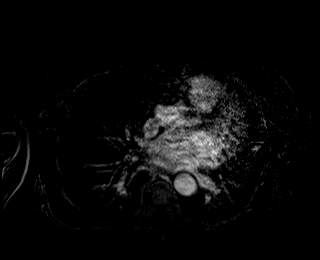

[Series 15: T1 dynamic · axial · 3.0mm · 1.19mm/px · z∈[-65,+196]mm · 3 of 88 slices shown (6 of 9)]
[im 1/88]
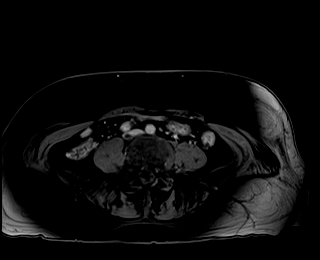
[im 44/88]
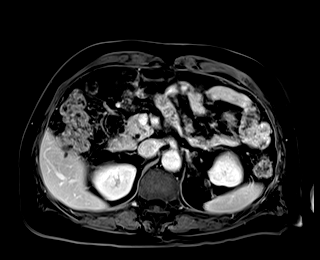
[im 88/88]
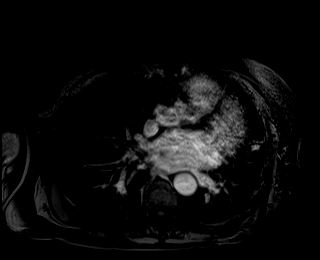

[Series 16: T1 dynamic · axial · 3.0mm · 1.19mm/px · z∈[-65,+196]mm · 3 of 88 slices shown (7 of 9)]
[im 1/88]
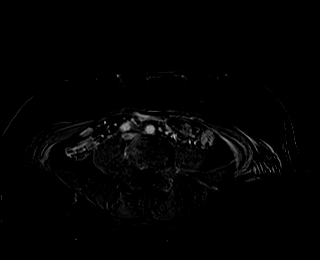
[im 44/88]
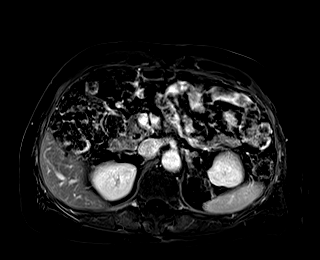
[im 88/88]
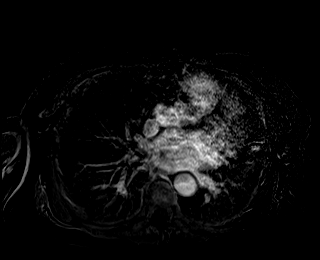

[Series 17: T1 dynamic post-contrast · coronal · 3.0mm · 1.31mm/px · 3 of 80 slices shown]
[im 1/80]
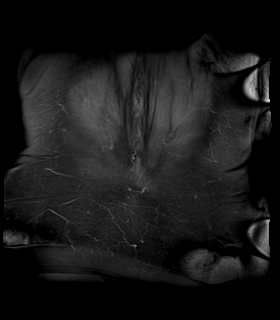
[im 40/80]
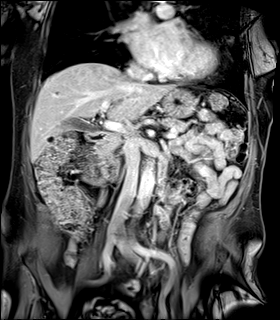
[im 80/80]
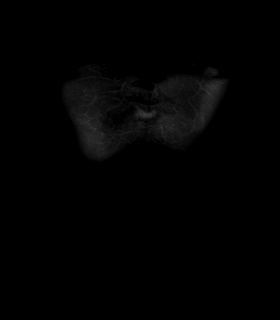

[Series 18: T2 · axial · 6.5mm · 1.19mm/px · 1 of 34 slices shown]
[im 1/34]
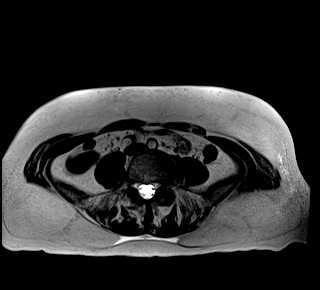

[Series 19: T1 dynamic · axial · 3.0mm · 1.19mm/px · z∈[-65,+196]mm · 3 of 88 slices shown (8 of 9)]
[im 1/88]
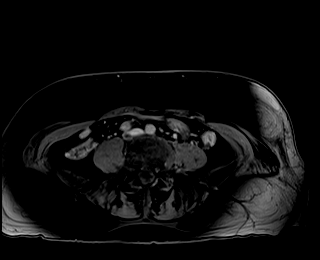
[im 44/88]
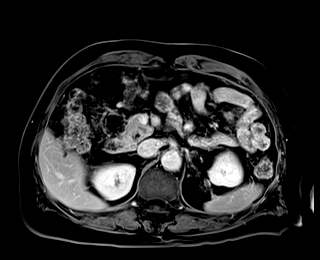
[im 88/88]
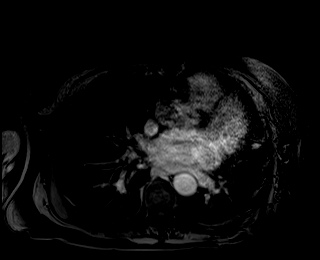

[Series 20: T1 dynamic · axial · 3.0mm · 1.19mm/px · z∈[-65,+196]mm · 3 of 88 slices shown (9 of 9)]
[im 1/88]
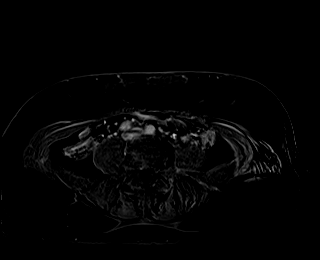
[im 44/88]
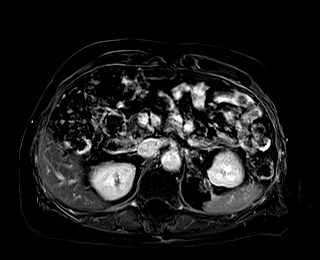
[im 88/88]
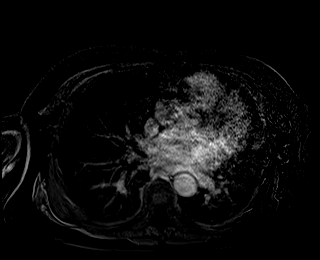

[48 of 48 positions shown; findings below may reference images not displayed]

FINDINGS: Lower chest: No acute findings.

Hepatobiliary: Benign-appearing cysts in both the right and left
lobes are again seen. No liver masses identified. Gallstones are
seen, however there is no evidence of cholecystitis or biliary
dilatation.

Pancreas: No mass or inflammatory changes. No evidence of pancreatic
ductal dilatation or pancreas divisum.

Spleen:  Within normal limits in size and appearance.

Adrenals/Urinary Tract: Normal appearance of both adrenal glands. A
cystic lesion in the posterior upper pole left kidney shows further
significant decrease in size since prior study, currently measuring
2.9 x 2.8 cm on image 52/15, compared to 8.9 x 7.5 cm on prior exam.
No solid renal masses are seen. No evidence of hydronephrosis.

Stomach/Bowel: Visualized portion unremarkable.

Vascular/Lymphatic: No pathologically enlarged lymph nodes
identified. No abdominal aortic aneurysm.

Other:  None.

Musculoskeletal:  No suspicious bone lesions identified.
IMPRESSION: Near complete resolution of cystic lesion in the upper pole of left
kidney, consistent with benign etiology.

Cholelithiasis. No radiographic evidence of cholecystitis or biliary
dilatation.

## 2021-03-29 IMAGING — CT CT RENAL STONE PROTOCOL
2 of 4 series · 15 of 46 positions shown, 17 images · non-contrast
Comparison: October 14, 2017

CLINICAL DATA: Right-sided abdominal pain

EXAM:
CT ABDOMEN AND PELVIS WITHOUT CONTRAST
TECHNIQUE: Multidetector CT imaging of the abdomen and pelvis was performed
following the standard protocol without IV contrast.

[Series 2: stone full standard · axial · 0.82mm/px · z∈[-464,-14]mm · 12 of 100 slices shown, 14 images]
[im 5/100  soft-tissue]
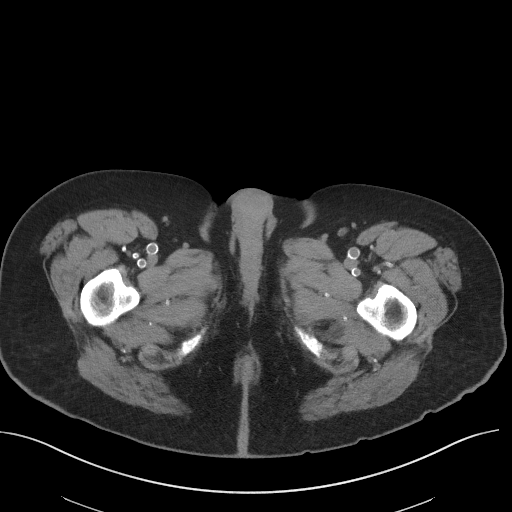
[im 5/100  bone]
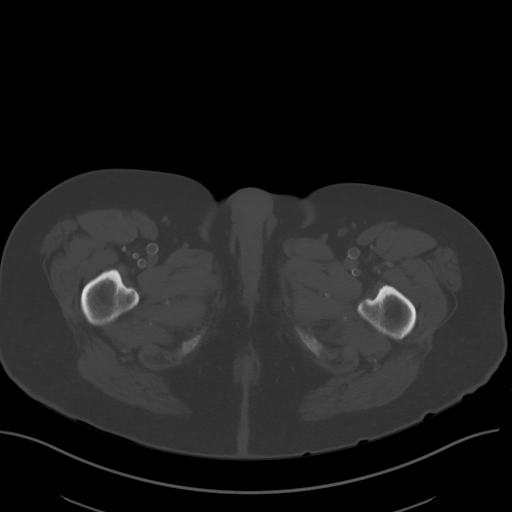
[im 13/100  soft-tissue]
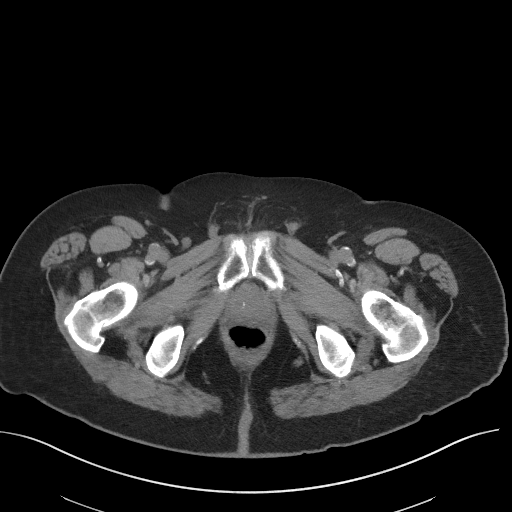
[im 21/100  soft-tissue]
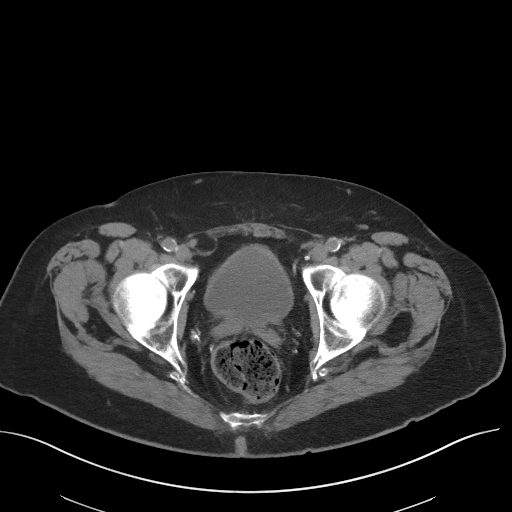
[im 29/100  soft-tissue]
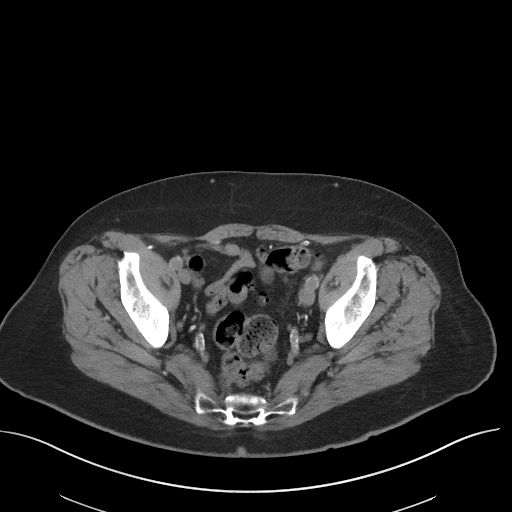
[im 38/100  soft-tissue]
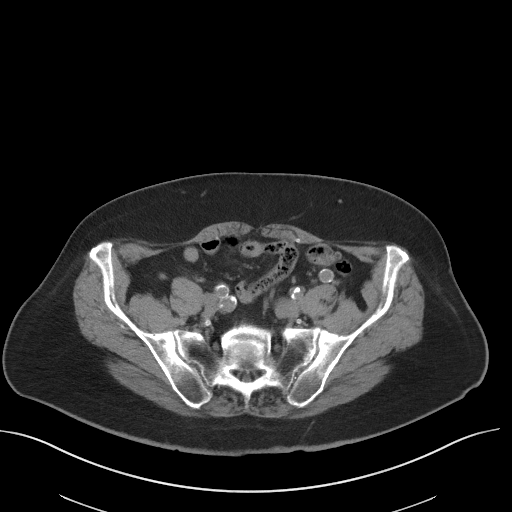
[im 46/100  soft-tissue]
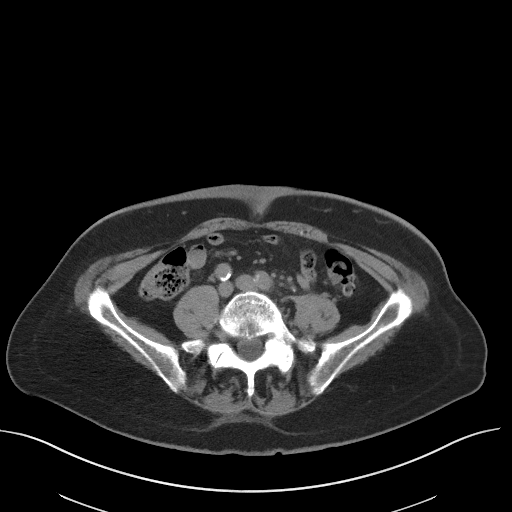
[im 54/100  soft-tissue]
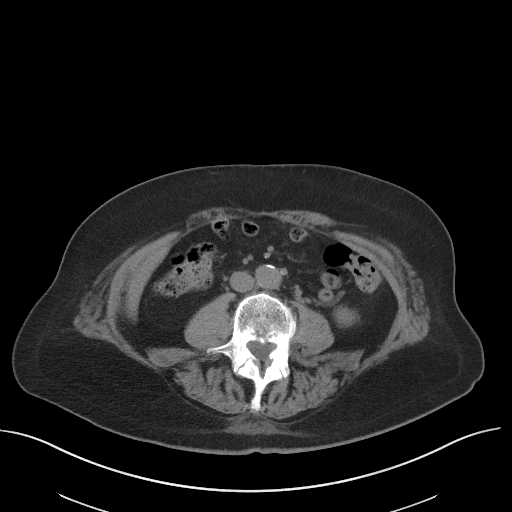
[im 62/100  soft-tissue]
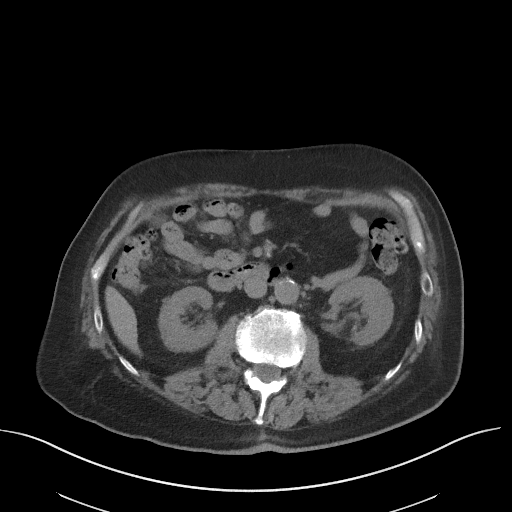
[im 71/100  soft-tissue]
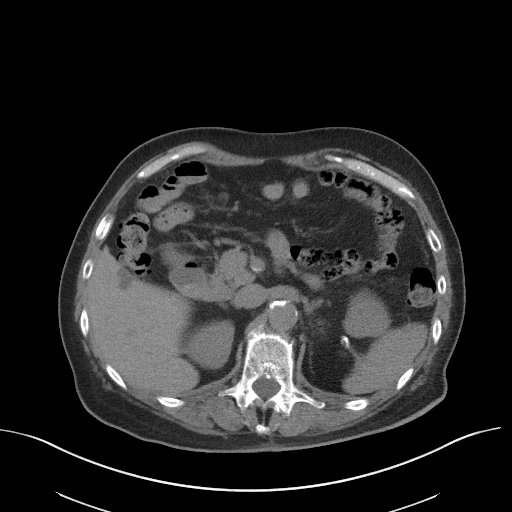
[im 71/100  bone]
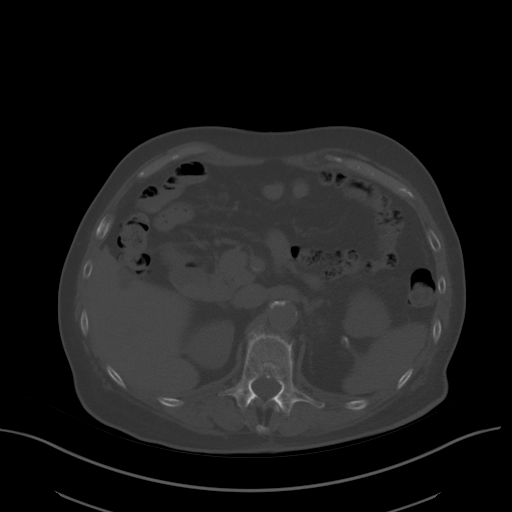
[im 79/100  soft-tissue]
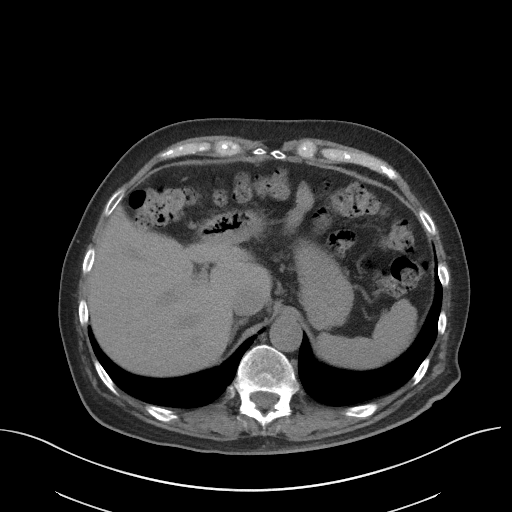
[im 87/100  soft-tissue]
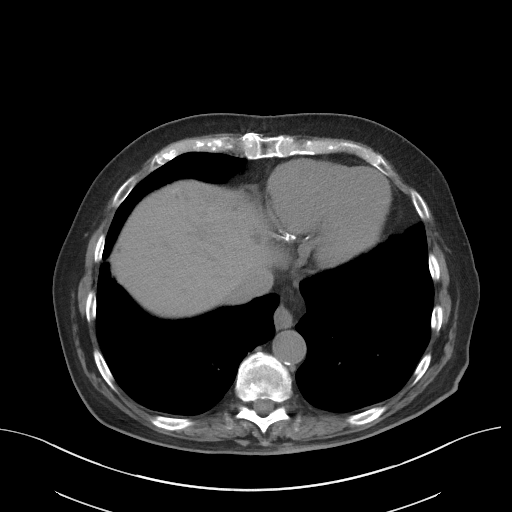
[im 95/100  soft-tissue]
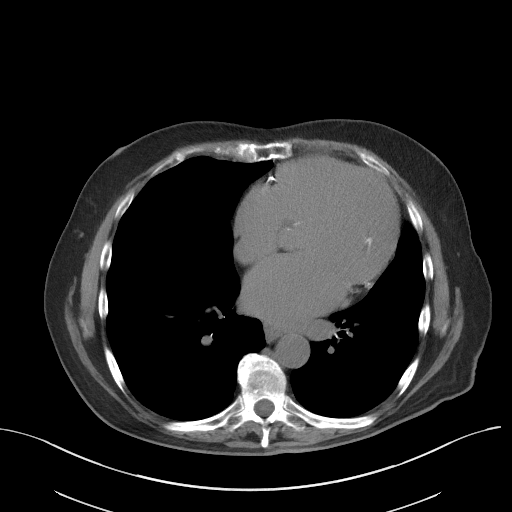

[Series 5: coronal · coronal · 0.74mm/px · 3 of 143 slices shown]
[im 48/143  soft-tissue]
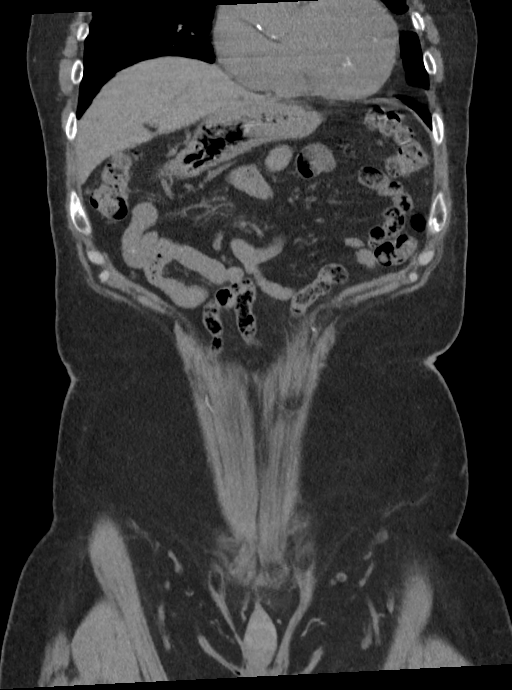
[im 64/143  soft-tissue]
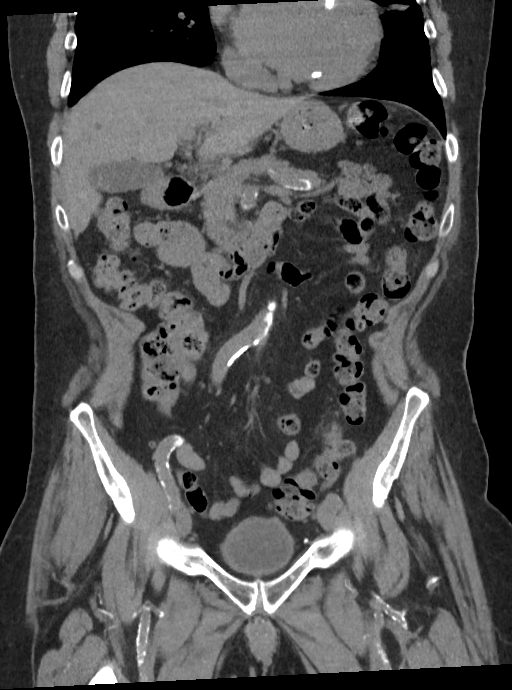
[im 79/143  soft-tissue]
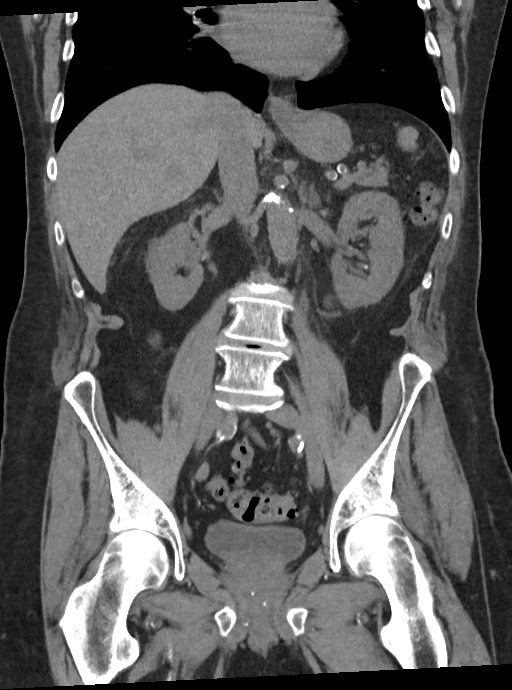

[15 of 46 positions shown; findings below may reference images not displayed]

FINDINGS: Lower chest: The lung bases are clear. The heart is enlarged.

Hepatobiliary: There are multiple cysts throughout the patient's
liver, relatively similar to prior study. Cholelithiasis without
acute inflammation.There is no biliary ductal dilation.

Pancreas: Normal contours without ductal dilatation. No
peripancreatic fluid collection.

Spleen: Unremarkable.

Adrenals/Urinary Tract:

--Adrenal glands: Unremarkable.

--Right kidney/ureter: There is small nonobstructing stones
throughout the right kidney. There is no right-sided hydronephrosis.
No ureteral stone.

--Left kidney/ureter: The previously demonstrated exophytic cyst
arising from the upper pole the left kidney appears collapsed and
periphery calcified. There are nonobstructing stones in the lower
pole the left kidney. There is no hydronephrosis. However, there is
a nonobstructing 3 mm stone in the mid left ureter (axial series 2,
image 54).

--Urinary bladder: Unremarkable.

Stomach/Bowel:

--Stomach/Duodenum: No hiatal hernia or other gastric abnormality.
Normal duodenal course and caliber.

--Small bowel: Unremarkable.

--Colon: Rectosigmoid diverticulosis without acute inflammation.

--Appendix: Normal.

Vascular/Lymphatic: Atherosclerotic calcification is present within
the non-aneurysmal abdominal aorta, without hemodynamically
significant stenosis.

--No retroperitoneal lymphadenopathy.

--No mesenteric lymphadenopathy.

--No pelvic or inguinal lymphadenopathy.

Reproductive: Unremarkable

Other: No ascites or free air. The abdominal wall is normal.

Musculoskeletal. No acute displaced fractures.
IMPRESSION: 1. There is a 3 mm nonobstructing stone in the mid left ureter.
There is no hydronephrosis.
2. Bilateral nonobstructing nephrolithiasis.
3. Cholelithiasis without acute inflammation.
4. Rectosigmoid diverticulosis without acute inflammation.
5. Cardiomegaly.

Aortic Atherosclerosis ([id number].L).

## 2022-05-16 ENCOUNTER — Other Ambulatory Visit: Payer: Self-pay | Admitting: Internal Medicine

## 2022-05-16 DIAGNOSIS — R410 Disorientation, unspecified: Secondary | ICD-10-CM

## 2022-05-29 ENCOUNTER — Ambulatory Visit
Admission: RE | Admit: 2022-05-29 | Discharge: 2022-05-29 | Disposition: A | Discharge status: Home / Self Care | Payer: Medicare Other | Source: Ambulatory Visit | Attending: Internal Medicine | Admitting: Internal Medicine

## 2022-05-29 DIAGNOSIS — R410 Disorientation, unspecified: Secondary | ICD-10-CM | POA: Diagnosis present

## 2023-09-18 ENCOUNTER — Emergency Department
Admission: EM | Admit: 2023-09-18 | Discharge: 2023-09-18 | Disposition: A | Discharge status: Home / Self Care | Attending: Emergency Medicine | Admitting: Emergency Medicine

## 2023-09-18 ENCOUNTER — Other Ambulatory Visit: Payer: Self-pay

## 2023-09-18 ENCOUNTER — Emergency Department

## 2023-09-18 DIAGNOSIS — W19XXXA Unspecified fall, initial encounter: Secondary | ICD-10-CM | POA: Diagnosis not present

## 2023-09-18 DIAGNOSIS — F0390 Unspecified dementia without behavioral disturbance: Secondary | ICD-10-CM | POA: Diagnosis not present

## 2023-09-18 DIAGNOSIS — Z7901 Long term (current) use of anticoagulants: Secondary | ICD-10-CM | POA: Insufficient documentation

## 2023-09-18 DIAGNOSIS — G20A1 Parkinson's disease without dyskinesia, without mention of fluctuations: Secondary | ICD-10-CM | POA: Diagnosis not present

## 2023-09-18 DIAGNOSIS — R8289 Other abnormal findings on cytological and histological examination of urine: Secondary | ICD-10-CM | POA: Insufficient documentation

## 2023-09-18 DIAGNOSIS — S0990XA Unspecified injury of head, initial encounter: Secondary | ICD-10-CM | POA: Diagnosis present

## 2023-09-18 LAB — COMPREHENSIVE METABOLIC PANEL WITH GFR
ALT: <5 U/L (ref 0–44)
AST: 20 U/L (ref 15–41)
Albumin: 3.7 g/dL (ref 3.5–5.0)
Alkaline Phosphatase: 42 U/L (ref 38–126)
Anion gap: 12 (ref 5–15)
BUN: 19 mg/dL (ref 8–23)
CO2: 26 mmol/L (ref 22–32)
Calcium: 9.5 mg/dL (ref 8.9–10.3)
Chloride: 104 mmol/L (ref 98–111)
Creatinine, Ser: 1.08 mg/dL (ref 0.61–1.24)
GFR, Estimated: >60 mL/min (ref >60)
Glucose, Bld: 105 mg/dL — ABNORMAL HIGH (ref 70–99)
Potassium: 4.2 mmol/L (ref 3.5–5.1)
Sodium: 142 mmol/L (ref 135–145)
Total Bilirubin: 0.8 mg/dL (ref 0.0–1.2)
Total Protein: 6.3 g/dL — ABNORMAL LOW (ref 6.5–8.1)

## 2023-09-18 LAB — CBC
HCT: 42.2 % (ref 39.0–52.0)
Hemoglobin: 13.9 g/dL (ref 13.0–17.0)
MCH: 32.4 pg (ref 26.0–34.0)
MCHC: 32.9 g/dL (ref 30.0–36.0)
MCV: 98.4 fL (ref 80.0–100.0)
Platelets: 203 K/uL (ref 150–400)
RBC: 4.29 MIL/uL (ref 4.22–5.81)
RDW: 13.4 % (ref 11.5–15.5)
WBC: 9.6 K/uL (ref 4.0–10.5)
nRBC: 0 % (ref 0.0–0.2)

## 2023-09-18 LAB — URINALYSIS, ROUTINE W REFLEX MICROSCOPIC
Bacteria, UA: NONE SEEN
Bilirubin Urine: NEGATIVE
Glucose, UA: NEGATIVE mg/dL
Ketones, ur: 20 mg/dL — AB
Nitrite: NEGATIVE
Protein, ur: 100 mg/dL — AB
RBC / HPF: >50 RBC/hpf (ref 0–5)
Specific Gravity, Urine: 1.024 (ref 1.005–1.030)
WBC, UA: >50 WBC/hpf (ref 0–5)
pH: 5 (ref 5.0–8.0)

## 2023-09-18 LAB — PROTIME-INR
INR: 3 — ABNORMAL HIGH (ref 0.8–1.2)
Prothrombin Time: 32.4 s — ABNORMAL HIGH (ref 11.4–15.2)

## 2023-09-18 NOTE — ED Notes (Signed)
 Attempted 3x for UA sample. Patient denies need to urinate.

## 2023-09-18 NOTE — ED Triage Notes (Signed)
 Coming from home. had a witnessed fall wife but unsure if pt hit his head. No obvious signs of injury reported by EMDS. No loss of consciousness. PATIETN TAKES COUMIDIN. Hs. a-fib and Parkinson's. Pt alert to baseline. Patient has recurrent UTI. Patient finished antibiotic 2 weeks ago. Mental decline and generalized weakness. Patient is AOX2  EMS VS: 128/69, 95%, 113, CBG: 120, 97.1,

## 2023-09-18 NOTE — ED Notes (Signed)
 Patient's wife asked RN who asked EDP Ray if the patient could take his 1400 Parkinson's medication. EDP said yes he could and only his parkinson's medications with small sip's of water. Education provided to wife and other family member at bedside.

## 2023-09-18 NOTE — ED Provider Notes (Addendum)
 [facility] Provider Note    Event Date/Time   First MD Initiated Contact with Patient 09/18/23 1608     (approximate)   History   Fall   HPI  Derrick Miller is a 83 y.o. male who presents to the ED for evaluation of Fall   Patient presents to the ED from home with his wife after she witnessed him falling.  Patient has no complaints.  He takes Coumadin for atrial fibrillation stroke prophylaxis.  Wife witnessed the fall and is not sure if he hit his head or anything in particular but wants to get it checked out.  She reports that he seems to be acting at his baseline with his known dementia and Parkinson's disease.  No emesis, fevers or additional concerns at home.   Physical Exam   Triage Vital Signs: ED Triage Vitals  Encounter Vitals Group     BP 09/18/23 1320 134/81     Girls Systolic BP Percentile --      Girls Diastolic BP Percentile --      Boys Systolic BP Percentile --      Boys Diastolic BP Percentile --      Pulse Rate 09/18/23 1320 (!) 112     Resp 09/18/23 1320 17     Temp 09/18/23 1322 97.7 F (36.5 C)     Temp Source 09/18/23 1322 Oral     SpO2 09/18/23 1320 98 %     Weight 09/18/23 1320 162 lb 4.1 oz (73.6 kg)     Height 09/18/23 1320 5' 10 (1.778 m)     Head Circumference --      Peak Flow --      Pain Score 09/18/23 1320 0     Pain Loc --      Pain Education --      Exclude from Growth Chart --     Most recent vital signs: Vitals:   09/18/23 1600 09/18/23 1750  BP: 121/76   Pulse: 97   Resp: 12   Temp:  97.8 F (36.6 C)  SpO2: 95%     General: Awake, no distress.  CV:  Good peripheral perfusion.  Resp:  Normal effort.  Abd:  No distention.  MSK:  No deformity noted.  Neuro:  No focal deficits appreciated. Other:     ED Results / Procedures / Treatments   Labs (all labs ordered are listed, but only abnormal results are displayed) Labs Reviewed  COMPREHENSIVE METABOLIC PANEL WITH GFR - Abnormal;  Notable for the following components:      Result Value   Glucose, Bld 105 (*)    Total Protein 6.3 (*)    All other components within normal limits  URINALYSIS, ROUTINE W REFLEX MICROSCOPIC - Abnormal; Notable for the following components:   Color, Urine AMBER (*)    APPearance CLOUDY (*)    Hgb urine dipstick LARGE (*)    Ketones, ur 20 (*)    Protein, ur 100 (*)    Leukocytes,Ua TRACE (*)    All other components within normal limits  PROTIME-INR - Abnormal; Notable for the following components:   Prothrombin Time 32.4 (*)    INR 3.0 (*)    All other components within normal limits  URINE CULTURE  CBC    EKG Sinus rhythm with a rate of 111 bpm.  Tremulous baseline clouds fine detail.  No clear signs of acute ischemia.  1 PVC.  RADIOLOGY CT head interpreted by me without  evidence of acute intracranial pathology CT cervical spine interpreted by me without evidence of fracture or dislocation  Official radiology report(s): CT Head Wo Contrast Result Date: 09/18/2023 CLINICAL DATA:  Head trauma, minor (Age >= 65y); Neck trauma (Age >= 65y). Witnessed fall. On Coumadin. Generalized weakness. EXAM: CT HEAD WITHOUT CONTRAST CT CERVICAL SPINE WITHOUT CONTRAST TECHNIQUE: Multidetector CT imaging of the head and cervical spine was performed following the standard protocol without intravenous contrast. Multiplanar CT image reconstructions of the cervical spine were also generated. RADIATION DOSE REDUCTION: This exam was performed according to the departmental dose-optimization program which includes automated exposure control, adjustment of the mA and/or kV according to patient size and/or use of iterative reconstruction technique. COMPARISON:  Head CT 05/29/2022 and MRI 10/30/2017 FINDINGS: CT HEAD FINDINGS Brain: There is no evidence of an acute infarct, intracranial hemorrhage, mass, midline shift, or extra-axial fluid collection. There is an unchanged chronic infarct medially in the right  occipital lobe with mild ex vacuo dilatation of the occipital horn of the right lateral ventricle. There is mild global cerebral atrophy. Hypodensities elsewhere in the cerebral white matter are nonspecific but compatible with minimal chronic small vessel ischemic disease. Vascular: Calcified atherosclerosis at the skull base. No hyperdense vessel. Skull: No fracture or suspicious lesion. Sinuses/Orbits: Scattered minimal mucosal thickening and small mucous retention cysts in the paranasal sinuses. Clear mastoid air cells. Unremarkable orbits. Other: None. CT CERVICAL SPINE FINDINGS Alignment: Trace anterolisthesis of C3 on C4 and C4 on C5 with mild focal lordosis at the former. Skull base and vertebrae: No acute fracture or suspicious lesion. Soft tissues and spinal canal: No prevertebral fluid or swelling. No visible canal hematoma. Disc levels: Diffuse cervical disc degeneration, including severe disc space narrowing at C7-T1 and moderate narrowing at C3-4, C5-6, and C6-7 with associated degenerative endplate changes. Widespread facet arthrosis which is particularly severe on the right at C2-3 and C4-5 and on the left at C3-4. Severe left neural foraminal stenosis at C3-4 due to uncovertebral and facet spurring. No evidence of high-grade spinal canal stenosis. Upper chest: Included lung apices are clear. Other: Mild atherosclerotic calcification at the right greater than left carotid bifurcations. IMPRESSION: 1. No evidence of acute intracranial abnormality or cervical spine fracture. 2. Chronic right occipital infarct. 3. 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 Electronically Signed   By: Dasie Hamburg M.D.   On: 09/18/2023 16:40   CT Cervical Spine Wo Contrast Result Date: 09/18/2023 CLINICAL DATA:  Head trauma, minor (Age >= 65y); Neck trauma (Age >= 65y). Witnessed fall. On Coumadin. Generalized weakness. EXAM: CT HEAD WITHOUT CONTRAST CT CERVICAL SPINE WITHOUT CONTRAST TECHNIQUE:  Multidetector CT imaging of the head and cervical spine was performed following the standard protocol without intravenous contrast. Multiplanar CT image reconstructions of the cervical spine were also generated. RADIATION DOSE REDUCTION: This exam was performed according to the departmental dose-optimization program which includes automated exposure control, adjustment of the mA and/or kV according to patient size and/or use of iterative reconstruction technique. COMPARISON:  Head CT 05/29/2022 and MRI 10/30/2017 FINDINGS: CT HEAD FINDINGS Brain: There is no evidence of an acute infarct, intracranial hemorrhage, mass, midline shift, or extra-axial fluid collection. There is an unchanged chronic infarct medially in the right occipital lobe with mild ex vacuo dilatation of the occipital horn of the right lateral ventricle. There is mild global cerebral atrophy. Hypodensities elsewhere in the cerebral white matter are nonspecific but compatible with minimal chronic small vessel ischemic disease. Vascular: Calcified  atherosclerosis at the skull base. No hyperdense vessel. Skull: No fracture or suspicious lesion. Sinuses/Orbits: Scattered minimal mucosal thickening and small mucous retention cysts in the paranasal sinuses. Clear mastoid air cells. Unremarkable orbits. Other: None. CT CERVICAL SPINE FINDINGS Alignment: Trace anterolisthesis of C3 on C4 and C4 on C5 with mild focal lordosis at the former. Skull base and vertebrae: No acute fracture or suspicious lesion. Soft tissues and spinal canal: No prevertebral fluid or swelling. No visible canal hematoma. Disc levels: Diffuse cervical disc degeneration, including severe disc space narrowing at C7-T1 and moderate narrowing at C3-4, C5-6, and C6-7 with associated degenerative endplate changes. Widespread facet arthrosis which is particularly severe on the right at C2-3 and C4-5 and on the left at C3-4. Severe left neural foraminal stenosis at C3-4 due to uncovertebral  and facet spurring. No evidence of high-grade spinal canal stenosis. Upper chest: Included lung apices are clear. Other: Mild atherosclerotic calcification at the right greater than left carotid bifurcations. IMPRESSION: 1. No evidence of acute intracranial abnormality or cervical spine fracture. 2. Chronic right occipital infarct. 3. 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 Electronically Signed   By: Dasie Hamburg M.D.   On: 09/18/2023 16:40    PROCEDURES and INTERVENTIONS:  .1-3 Lead EKG Interpretation  Performed by: Claudene Rover, MD Authorized by: Claudene Rover, MD     Interpretation: abnormal     ECG rate:  102   ECG rate assessment: tachycardic     Rhythm: sinus tachycardia     Ectopy: none     Conduction: normal     Medications - No data to display   IMPRESSION / MDM / ASSESSMENT AND PLAN / ED COURSE  I reviewed the triage vital signs and the nursing notes.  Differential diagnosis includes, but is not limited to, ICH, skull fracture, stroke, cardiac dysrhythmia  {Patient presents with symptoms of an acute illness or injury that is potentially life-threatening.  Anticoagulated patient presents after a fall without clear signs of acute pathology and suitable for outpatient management.  No clear signs of trauma on exam, he is at his baseline.  INR therapeutic, normal CBC and metabolic panel.  Clear imaging, no indication based off of exam for additional imaging.  Suitable for outpatient management with family.  As below, patient does eventually give us  a urine sample that is cloudy in color.  He denies dysuria with providing the sample.  Trace leukocytes and no bacteria with greater than 50 red and white cells.  We will send this for culture and hold off on antibiotics at this point.  Clinical Course as of 09/18/23 1850  Thu Sep 18, 2023  1713 Reassessed, patient in the bathroom trying to pass a bowel movement.  Discussed with wife and son at the bedside.  They  agree that he seems to be at his baseline.  We have not been able to get a urine sample but they are comfortable taking him home without a urinalysis due to low suspicion for cystitis at this point. [DS]  1750 We were able to get a urine sample.  We will send this and await these results [DS]    Clinical Course User Index [DS] Claudene Rover, MD     FINAL CLINICAL IMPRESSION(S) / ED DIAGNOSES   Final diagnoses:  Fall, initial encounter  Anticoagulated     Rx / DC Orders   ED Discharge Orders     None        Note:  This  document was prepared using Conservation officer, historic buildings and may include unintentional dictation errors.   Claudene Rover, MD 09/18/23 1715    Claudene Rover, MD 09/18/23 [phone]

## 2023-09-18 NOTE — ED Notes (Signed)
 Provided pt with hospital socks, and provided urinal for pt with assistance by bedside. Pt is back in bed at this time with call bell and put back on monitor at this time. RN sent urine sample collected at this time.

## 2023-09-19 LAB — URINE CULTURE: Culture: <10000 — AB

## 2023-12-24 ENCOUNTER — Encounter: Payer: Self-pay | Admitting: Emergency Medicine

## 2023-12-24 ENCOUNTER — Emergency Department
Admission: EM | Admit: 2023-12-24 | Discharge: 2023-12-24 | Disposition: A | Discharge status: Home / Self Care | Attending: Emergency Medicine | Admitting: Emergency Medicine

## 2023-12-24 ENCOUNTER — Other Ambulatory Visit: Payer: Self-pay

## 2023-12-24 DIAGNOSIS — I509 Heart failure, unspecified: Secondary | ICD-10-CM | POA: Diagnosis not present

## 2023-12-24 DIAGNOSIS — R319 Hematuria, unspecified: Secondary | ICD-10-CM | POA: Diagnosis present

## 2023-12-24 DIAGNOSIS — G20C Parkinsonism, unspecified: Secondary | ICD-10-CM | POA: Insufficient documentation

## 2023-12-24 DIAGNOSIS — N390 Urinary tract infection, site not specified: Secondary | ICD-10-CM | POA: Insufficient documentation

## 2023-12-24 LAB — COMPREHENSIVE METABOLIC PANEL WITH GFR
ALT: <5 U/L (ref 0–44)
AST: 25 U/L (ref 15–41)
Albumin: 3.7 g/dL (ref 3.5–5.0)
Alkaline Phosphatase: 50 U/L (ref 38–126)
Anion gap: 10 (ref 5–15)
BUN: 12 mg/dL (ref 8–23)
CO2: 26 mmol/L (ref 22–32)
Calcium: 9.1 mg/dL (ref 8.9–10.3)
Chloride: 102 mmol/L (ref 98–111)
Creatinine, Ser: 0.89 mg/dL (ref 0.61–1.24)
GFR, Estimated: >60 mL/min (ref >60)
Glucose, Bld: 103 mg/dL — ABNORMAL HIGH (ref 70–99)
Potassium: 4.5 mmol/L (ref 3.5–5.1)
Sodium: 138 mmol/L (ref 135–145)
Total Bilirubin: 0.9 mg/dL (ref 0.0–1.2)
Total Protein: 6.4 g/dL — ABNORMAL LOW (ref 6.5–8.1)

## 2023-12-24 LAB — URINALYSIS, ROUTINE W REFLEX MICROSCOPIC
Bilirubin Urine: NEGATIVE
Glucose, UA: NEGATIVE mg/dL
Ketones, ur: 5 mg/dL — AB
Nitrite: NEGATIVE
Protein, ur: 100 mg/dL — AB
RBC / HPF: >50 RBC/hpf (ref 0–5)
Specific Gravity, Urine: 1.014 (ref 1.005–1.030)
Squamous Epithelial / HPF: 0 /HPF (ref 0–5)
WBC, UA: >50 WBC/hpf (ref 0–5)
pH: 6 (ref 5.0–8.0)

## 2023-12-24 LAB — CBC
HCT: 42.3 % (ref 39.0–52.0)
Hemoglobin: 13.8 g/dL (ref 13.0–17.0)
MCH: 31.4 pg (ref 26.0–34.0)
MCHC: 32.6 g/dL (ref 30.0–36.0)
MCV: 96.4 fL (ref 80.0–100.0)
Platelets: 196 K/uL (ref 150–400)
RBC: 4.39 MIL/uL (ref 4.22–5.81)
RDW: 13.2 % (ref 11.5–15.5)
WBC: 4.9 K/uL (ref 4.0–10.5)
nRBC: 0 % (ref 0.0–0.2)

## 2023-12-24 MED ORDER — CEPHALEXIN 500 MG PO CAPS: 500.0000 mg | ORAL_CAPSULE | Freq: Three times a day (TID) | ORAL | 0 refills | Status: AC

## 2023-12-24 MED ORDER — CEPHALEXIN 500 MG PO CAPS
500.0000 mg | ORAL_CAPSULE | Freq: Once | ORAL | Status: AC
  Administered 2023-12-24: 500 mg via ORAL
  Filled 2023-12-24: qty 1

## 2023-12-24 NOTE — ED Notes (Signed)
 Patient taken off cardiac monitoring, BP cuff and pulse ox due to discharge and per patient's request. CCMD made aware.

## 2023-12-24 NOTE — ED Notes (Signed)
 [facility] called for  transport  home

## 2023-12-24 NOTE — ED Provider Notes (Signed)
 [facility] Provider Note    Event Date/Time   First MD Initiated Contact with Patient 12/24/23 1500     (approximate)   History   Weakness   HPI  Derrick Miller is a 83 y.o. male with a history of atrial fibrillation CHF, Parkinson's disease on hospice sent in for evaluation of hematuria.  Caregiver reports that hematuria noted over the last several days, no fevers reported.     Physical Exam   Triage Vital Signs: ED Triage Vitals  Encounter Vitals Group     BP 12/24/23 1440 99/81     Girls Systolic BP Percentile --      Girls Diastolic BP Percentile --      Boys Systolic BP Percentile --      Boys Diastolic BP Percentile --      Pulse Rate 12/24/23 1437 94     Resp 12/24/23 1437 18     Temp 12/24/23 1437 (!) 97.5 F (36.4 C)     Temp Source 12/24/23 1437 Oral     SpO2 12/24/23 1437 98 %     Weight 12/24/23 1438 72.6 kg (160 lb)     Height 12/24/23 1438 1.778 m (5' 10)     Head Circumference --      Peak Flow --      Pain Score 12/24/23 1438 0     Pain Loc --      Pain Education --      Exclude from Growth Chart --     Most recent vital signs: Vitals:   12/24/23 1800 12/24/23 1905  BP: 136/79 (!) 150/79  Pulse: 80 88  Resp: 18 18  Temp:  97.8 F (36.6 C)  SpO2: 100% 99%     General: Awake, no distress.  CV:  Good peripheral perfusion.  Resp:  Normal effort.  Abd:  No distention.  Soft, nontender, no CVA tenderness Other:     ED Results / Procedures / Treatments   Labs (all labs ordered are listed, but only abnormal results are displayed) Labs Reviewed  COMPREHENSIVE METABOLIC PANEL WITH GFR - Abnormal; Notable for the following components:      Result Value   Glucose, Bld 103 (*)    Total Protein 6.4 (*)    All other components within normal limits  URINALYSIS, ROUTINE W REFLEX MICROSCOPIC - Abnormal; Notable for the following components:   Color, Urine AMBER (*)    APPearance CLOUDY (*)    Hgb urine dipstick  LARGE (*)    Ketones, ur 5 (*)    Protein, ur 100 (*)    Leukocytes,Ua TRACE (*)    Bacteria, UA RARE (*)    All other components within normal limits  CBC  CBG MONITORING, ED     EKG  ED ECG REPORT I, Lamar Price, the attending physician, personally viewed and interpreted this ECG.  Date: 12/24/2023  Rhythm: normal sinus rhythm QRS Axis: normal Intervals: normal ST/T Wave abnormalities: normal Narrative Interpretation: no evidence of acute ischemia    RADIOLOGY     PROCEDURES:  Critical Care performed:   Procedures   MEDICATIONS ORDERED IN ED: Medications  cephALEXin (KEFLEX) capsule 500 mg (500 mg Oral Given 12/24/23 1827)     IMPRESSION / MDM / ASSESSMENT AND PLAN / ED COURSE  I reviewed the triage vital signs and the nursing notes. Patient's presentation is most consistent with acute illness / injury with system symptoms.  Patient presents with reported hematuria, caregiver  reports urine symptom is black.  However the patient is afebrile generally well-appearing and in no acute distress, he is on blood thinners.  Will check urine, labs reevaluate.  Work is overall quite reassuring, normal white blood cell count, normal hemoglobin.  Chemistry is unremarkable.  Urinalysis does demonstrate positive hemoglobin, greater than 50 white blood cells, positive leuks.  Hematuria could be related to blood thinner usage possible early infection, will cover with antibiotics, however no indication for admission at this time, outpatient follow-up as needed with urology/PCP.          FINAL CLINICAL IMPRESSION(S) / ED DIAGNOSES   Final diagnoses:  Lower urinary tract infectious disease  Hematuria, unspecified type     Rx / DC Orders   ED Discharge Orders          Ordered    cephALEXin (KEFLEX) 500 MG capsule  3 times daily        12/24/23 1820             Note:  This document was prepared using Dragon voice recognition software and may include  unintentional dictation errors.   [name], MD 12/24/23 [phone]

## 2023-12-24 NOTE — ED Notes (Signed)
 Patient's spouse given discharge instructions via previous RN. Patient stable and placed onto stretcher with [facility] staff to be transported home.

## 2023-12-24 NOTE — ED Triage Notes (Signed)
 Patient to ED via [facility] from home for urinating black x5 days. Denies burning or frequency. Also having weakness and multiple falls. Denies injuries from the falls- last one 2 days ago. +blood thinners.   97.6 oral 143/74 82 HR 99% RA 137 cbg

## 2023-12-24 NOTE — ED Notes (Signed)
 Awaiting [facility] for transport; discharge instructions with patient's wife.

## 2023-12-24 NOTE — ED Notes (Signed)
 Called CCMD to place patient on monitor

## 2024-01-19 DEATH — deceased
